# Patient Record
Sex: Female | Born: 1965 | Race: Black or African American | Hispanic: No | Smoking: Never smoker
Health system: Southern US, Community
[De-identification: ages and names within clinical notes are randomized; demographics above are authoritative.]

## PROBLEM LIST (undated history)

## (undated) DIAGNOSIS — M8580 Other specified disorders of bone density and structure, unspecified site: Secondary | ICD-10-CM

## (undated) DIAGNOSIS — R55 Syncope and collapse: Secondary | ICD-10-CM

## (undated) DIAGNOSIS — E559 Vitamin D deficiency, unspecified: Secondary | ICD-10-CM

## (undated) DIAGNOSIS — I2510 Atherosclerotic heart disease of native coronary artery without angina pectoris: Secondary | ICD-10-CM

## (undated) DIAGNOSIS — R569 Unspecified convulsions: Secondary | ICD-10-CM

## (undated) DIAGNOSIS — E7800 Pure hypercholesterolemia, unspecified: Secondary | ICD-10-CM

## (undated) DIAGNOSIS — M519 Unspecified thoracic, thoracolumbar and lumbosacral intervertebral disc disorder: Secondary | ICD-10-CM

## (undated) HISTORY — DX: Atherosclerotic heart disease of native coronary artery without angina pectoris: I25.10

## (undated) HISTORY — DX: Unspecified convulsions: R56.9

## (undated) HISTORY — DX: Vitamin D deficiency, unspecified: E55.9

## (undated) HISTORY — DX: Other specified disorders of bone density and structure, unspecified site: M85.80

## (undated) HISTORY — DX: Unspecified thoracic, thoracolumbar and lumbosacral intervertebral disc disorder: M51.9

## (undated) HISTORY — DX: Syncope and collapse: R55

## (undated) HISTORY — DX: Pure hypercholesterolemia, unspecified: E78.00

---

## 1998-09-09 ENCOUNTER — Other Ambulatory Visit: Admission: RE | Admit: 1998-09-09 | Discharge: 1998-09-09 | Payer: Self-pay | Admitting: Obstetrics and Gynecology

## 1999-10-19 ENCOUNTER — Other Ambulatory Visit: Admission: RE | Admit: 1999-10-19 | Discharge: 1999-10-19 | Payer: Self-pay | Admitting: *Deleted

## 1999-11-26 ENCOUNTER — Encounter: Payer: Self-pay | Admitting: Emergency Medicine

## 1999-11-26 ENCOUNTER — Emergency Department (HOSPITAL_COMMUNITY): Admission: EM | Admit: 1999-11-26 | Discharge: 1999-11-26 | Payer: Self-pay | Admitting: Emergency Medicine

## 2000-10-23 ENCOUNTER — Other Ambulatory Visit: Admission: RE | Admit: 2000-10-23 | Discharge: 2000-10-23 | Payer: Self-pay | Admitting: *Deleted

## 2001-05-08 DIAGNOSIS — R55 Syncope and collapse: Secondary | ICD-10-CM

## 2001-05-08 HISTORY — DX: Syncope and collapse: R55

## 2001-07-19 ENCOUNTER — Encounter: Payer: Self-pay | Admitting: Emergency Medicine

## 2001-07-19 ENCOUNTER — Emergency Department (HOSPITAL_COMMUNITY): Admission: EM | Admit: 2001-07-19 | Discharge: 2001-07-19 | Payer: Self-pay | Admitting: Emergency Medicine

## 2001-08-22 ENCOUNTER — Ambulatory Visit (HOSPITAL_COMMUNITY): Admission: RE | Admit: 2001-08-22 | Discharge: 2001-08-22 | Payer: Self-pay | Admitting: Cardiology

## 2001-10-23 ENCOUNTER — Ambulatory Visit (HOSPITAL_BASED_OUTPATIENT_CLINIC_OR_DEPARTMENT_OTHER): Admission: RE | Admit: 2001-10-23 | Discharge: 2001-10-23 | Payer: Self-pay | Admitting: *Deleted

## 2001-10-30 ENCOUNTER — Other Ambulatory Visit: Admission: RE | Admit: 2001-10-30 | Discharge: 2001-10-30 | Payer: Self-pay | Admitting: Obstetrics and Gynecology

## 2002-03-03 ENCOUNTER — Emergency Department (HOSPITAL_COMMUNITY): Admission: EM | Admit: 2002-03-03 | Discharge: 2002-03-03 | Payer: Self-pay | Admitting: *Deleted

## 2002-03-30 ENCOUNTER — Emergency Department (HOSPITAL_COMMUNITY): Admission: EM | Admit: 2002-03-30 | Discharge: 2002-03-30 | Payer: Self-pay | Admitting: Emergency Medicine

## 2002-03-30 ENCOUNTER — Encounter: Payer: Self-pay | Admitting: Emergency Medicine

## 2002-03-30 ENCOUNTER — Inpatient Hospital Stay (HOSPITAL_COMMUNITY): Admission: EM | Admit: 2002-03-30 | Discharge: 2002-04-01 | Payer: Self-pay | Admitting: Emergency Medicine

## 2002-03-30 ENCOUNTER — Encounter: Payer: Self-pay | Admitting: Neurology

## 2002-12-01 ENCOUNTER — Other Ambulatory Visit: Admission: RE | Admit: 2002-12-01 | Discharge: 2002-12-01 | Payer: Self-pay | Admitting: Obstetrics and Gynecology

## 2004-06-23 ENCOUNTER — Encounter: Admission: RE | Admit: 2004-06-23 | Discharge: 2004-06-23 | Payer: Self-pay | Admitting: Internal Medicine

## 2005-07-11 ENCOUNTER — Encounter: Admission: RE | Admit: 2005-07-11 | Discharge: 2005-07-11 | Payer: Self-pay | Admitting: Internal Medicine

## 2006-08-13 ENCOUNTER — Encounter: Admission: RE | Admit: 2006-08-13 | Discharge: 2006-08-13 | Payer: Self-pay | Admitting: Internal Medicine

## 2006-08-22 ENCOUNTER — Encounter: Admission: RE | Admit: 2006-08-22 | Discharge: 2006-08-22 | Payer: Self-pay | Admitting: Internal Medicine

## 2007-10-17 ENCOUNTER — Encounter: Admission: RE | Admit: 2007-10-17 | Discharge: 2007-10-17 | Payer: Self-pay | Admitting: Internal Medicine

## 2008-08-20 ENCOUNTER — Encounter: Admission: RE | Admit: 2008-08-20 | Discharge: 2008-08-20 | Payer: Self-pay | Admitting: Internal Medicine

## 2008-11-24 ENCOUNTER — Encounter: Admission: RE | Admit: 2008-11-24 | Discharge: 2008-11-24 | Payer: Self-pay | Admitting: Obstetrics and Gynecology

## 2009-12-01 ENCOUNTER — Encounter: Admission: RE | Admit: 2009-12-01 | Discharge: 2009-12-01 | Payer: Self-pay | Admitting: Internal Medicine

## 2010-09-23 NOTE — Procedures (Signed)
[facility]  Patient:    BRIAR, SWORD Visit Number: [id number] MRN: [id number]          Service Type: CAT Location: [facility] 2852 01 Attending Physician:  Armanda Magic Dictated by:   Armanda Magic, M.D. Proc. Date: 08/22/01 Admit Date:  08/22/2001 Discharge Date: 08/22/2001                             Procedure Report  DATE OF BIRTH:  Oct 03, 1965  PROCEDURE:  Tilt table testing.  SURGEON:  Armanda Magic, M.D.  INDICATIONS:  This is a 45 year old white female with a history of dizzy spells and presyncope.  She presents for tilt table testing.  DESCRIPTION OF PROCEDURE:  The patient was brought to the cardiac catheterization laboratory in a fasting, nonsedated state.  Informed consent was obtained.  The patient was connected to continuous heart rate and pulse oximetry monitoring and intermittent blood pressure monitoring.  The blood pressure was monitored for a total of nine minutes in the supine position. Blood pressure ranged from 131/82 to 143/89 mmHg.  Heart rate in the 60s.  The patient was then tilted upright to 70 degrees for a total of 30 minutes.  The lowest blood pressure achieved with upright tilt was 117/89 with heart rates in the 70s.  The patient was then placed supine and Accupril at 1 mcg per minute was started and then increased to 1.5 mcg per minute to achieve an increase in heart rate of 20%.  Made one blood pressure on Accupril 1.5 mcg per minute was 127/58 mmHg and heart rate 81.  The patient was then tilted upright 7 degrees for 15 minutes.  Lowest blood pressure achieved on this upright tilt was 117/66 with a heart rate in the 90s to low 100s.  The patient was then tilted back supine and  later discharged to home.  IMPRESSION: 1. Dizziness and presyncope. 2. Negative tilt table test. 3. Followup with Dr. Mayford Knife in two weeks. Dictated by:   Armanda Magic, M.D. Attending Physician:  Armanda Magic DD:   09/02/01 TD:  09/03/01 Job: [id number]
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
## 2010-12-06 ENCOUNTER — Other Ambulatory Visit: Payer: Self-pay | Admitting: Internal Medicine

## 2010-12-06 DIAGNOSIS — Z1231 Encounter for screening mammogram for malignant neoplasm of breast: Secondary | ICD-10-CM

## 2010-12-13 ENCOUNTER — Ambulatory Visit
Admission: RE | Admit: 2010-12-13 | Discharge: 2010-12-13 | Disposition: A | Discharge status: Home / Self Care | Payer: 59 | Source: Ambulatory Visit | Attending: Internal Medicine | Admitting: Internal Medicine

## 2010-12-13 DIAGNOSIS — Z1231 Encounter for screening mammogram for malignant neoplasm of breast: Secondary | ICD-10-CM

## 2011-12-15 ENCOUNTER — Other Ambulatory Visit: Payer: Self-pay | Admitting: Obstetrics and Gynecology

## 2011-12-15 DIAGNOSIS — Z1231 Encounter for screening mammogram for malignant neoplasm of breast: Secondary | ICD-10-CM

## 2011-12-18 ENCOUNTER — Other Ambulatory Visit: Payer: Self-pay | Admitting: Internal Medicine

## 2011-12-18 DIAGNOSIS — K439 Ventral hernia without obstruction or gangrene: Secondary | ICD-10-CM

## 2011-12-19 ENCOUNTER — Ambulatory Visit
Admission: RE | Admit: 2011-12-19 | Discharge: 2011-12-19 | Disposition: A | Discharge status: Home / Self Care | Payer: 59 | Source: Ambulatory Visit | Attending: Internal Medicine | Admitting: Internal Medicine

## 2011-12-19 DIAGNOSIS — K439 Ventral hernia without obstruction or gangrene: Secondary | ICD-10-CM

## 2012-01-02 ENCOUNTER — Ambulatory Visit
Admission: RE | Admit: 2012-01-02 | Discharge: 2012-01-02 | Disposition: A | Discharge status: Home / Self Care | Payer: 59 | Source: Ambulatory Visit | Attending: Obstetrics and Gynecology | Admitting: Obstetrics and Gynecology

## 2012-01-02 DIAGNOSIS — Z1231 Encounter for screening mammogram for malignant neoplasm of breast: Secondary | ICD-10-CM

## 2012-12-26 ENCOUNTER — Telehealth: Payer: Self-pay | Admitting: Neurology

## 2012-12-26 NOTE — Telephone Encounter (Signed)
Patient needs to be assigned to a new provider and also needs an appt.  Last OV was 07/2011.  Message was sent to Triage for assignment.

## 2012-12-30 ENCOUNTER — Telehealth: Payer: Self-pay | Admitting: Diagnostic Neuroimaging

## 2012-12-30 ENCOUNTER — Other Ambulatory Visit: Payer: Self-pay

## 2012-12-30 MED ORDER — ZONISAMIDE 100 MG PO CAPS: 300.0000 mg | ORAL_CAPSULE | Freq: Every day | ORAL | Status: DC

## 2012-12-30 NOTE — Telephone Encounter (Signed)
Reassigned to Dr. Marjory Lies.  Sent to scheduler.

## 2012-12-30 NOTE — Telephone Encounter (Signed)
Former Love patient assigned to Dr Penumalli. 

## 2013-01-16 ENCOUNTER — Other Ambulatory Visit: Payer: Self-pay

## 2013-01-16 DIAGNOSIS — Z1231 Encounter for screening mammogram for malignant neoplasm of breast: Secondary | ICD-10-CM

## 2013-02-06 ENCOUNTER — Ambulatory Visit
Admission: RE | Admit: 2013-02-06 | Discharge: 2013-02-06 | Disposition: A | Discharge status: Home / Self Care | Payer: 59 | Source: Ambulatory Visit

## 2013-02-06 DIAGNOSIS — Z1231 Encounter for screening mammogram for malignant neoplasm of breast: Secondary | ICD-10-CM

## 2013-04-16 ENCOUNTER — Encounter (INDEPENDENT_AMBULATORY_CARE_PROVIDER_SITE_OTHER): Payer: Self-pay

## 2013-04-16 ENCOUNTER — Encounter: Payer: Self-pay | Admitting: Diagnostic Neuroimaging

## 2013-04-16 ENCOUNTER — Ambulatory Visit (INDEPENDENT_AMBULATORY_CARE_PROVIDER_SITE_OTHER): Payer: 59 | Admitting: Diagnostic Neuroimaging

## 2013-04-16 VITALS — BP 143/91 | HR 64 | Temp 98.1°F | Ht 64.0 in | Wt 181.5 lb

## 2013-04-16 DIAGNOSIS — G40309 Generalized idiopathic epilepsy and epileptic syndromes, not intractable, without status epilepticus: Secondary | ICD-10-CM | POA: Insufficient documentation

## 2013-04-16 DIAGNOSIS — G40909 Epilepsy, unspecified, not intractable, without status epilepticus: Secondary | ICD-10-CM

## 2013-04-16 MED ORDER — ZONISAMIDE 100 MG PO CAPS: 300.0000 mg | ORAL_CAPSULE | Freq: Every day | ORAL | Status: DC

## 2013-04-16 NOTE — Progress Notes (Signed)
[facility]  PATIENT: Holly Colon DOB: [birth year]  REFERRING CLINICIAN:  HISTORY FROM: patient REASON FOR VISIT: follow up (Dr. Sandria Manly transfer)   HISTORICAL  CHIEF COMPLAINT:  Chief Complaint  Patient presents with  . Follow-up    Epilepsy    HISTORY OF PRESENT ILLNESS:   UPDATE 04/16/13: Patient is doing well. No further seizures. She's tolerating the medication, zonisamide 300 mg every morning without difficulty. Last seizure was 02/26/2011.  PRIOR HPI (07/12/11, CM): No seizure since 02/26/11. Pt had missed several doses of medication.  She says this happens periodically and she has seizures. She claims she saw Dr. Katrinka Blazing, cardiologist after last visit, had EKG which was normal and he suggested she get stress test at the age of 47. She denies missing further doses of medications. She denies macropsia, micropsia, strange odors or tastes.  PRIOR HPI (02/27/11, Dr. Sandria Manly): 47 year old left-handed single African American female with a history of generalized major motor seizures beginning in 2001. EEG showed bilateral synchronous spike and wave forms and MRIs of the brain 03/19/2002 and 08/31/06 and MRA 08/31/06 were normal. She had a skin reaction to Dilantin which had to be discontinued and her current medicines are Zonegran 300 mg once daily and for hyperlipidemia, she is on Lipitor 20 mg daily. She misses her medications and  when she does, she can have a seizure.This happened 04/25/2009. She averages about one seizure per year. The daughter reports that her eyes turned blank and she had a generalized major motor seizure. This is associated with urinary incontinence but not tongue biting. She is independent in activities of daily living. Last DEXA scan 09/20/09 with Z scores in the expected range. She had chest pain recently and underwent an EKG.The patient is independent in activities of daily living and her daughter is now 39.The patient was voting with her father  who has has had a stroke and had an episode of blanking  on the last name. Late in the day yesterday on Sunday she felt sluggish and lay down. Her daughter heard  a thump on the floor. She went upstairs and found the patient lying down.She had a abrasion to her scalp. She was slow in answering questions. She lost her hearing and bit her tongue. She is not to take her medicines correctly. She took them Friday and possibly Saturday. EMT was called. The patient refused to go to the hospital and called me today. She has not been abusing drugs or alcohol. Her last seizure prior to yesterday was 04/25/09  The patient has been driving her car which I warned her against today. She denies macropsia, micropsia, strange odors or tastes.   REVIEW OF SYSTEMS: Full 14 system review of systems performed and notable only for fatigue, chills, fatigue, easy bruising, shortness of breath.   ALLERGIES: No Known Allergies  HOME MEDICATIONS: Outpatient Prescriptions Prior to Visit  Medication Sig Dispense Refill  . zonisamide (ZONEGRAN) 100 MG capsule Take 3 capsules (300 mg total) by mouth at bedtime.  42 capsule  1   No facility-administered medications prior to visit.    PAST MEDICAL HISTORY: Past Medical History  Diagnosis Date  . High cholesterol   . Seizures     PAST SURGICAL HISTORY: History reviewed. No pertinent past surgical history.  FAMILY HISTORY: Family History  Problem Relation Age of Onset  . Heart attack Father   . Stroke Father     SOCIAL HISTORY:  History   Social History  . Marital  Status: Divorced    Spouse Name: N/A    Number of Children: 1  . Years of Education: 4   Occupational History  .  [employer]   Social History Main Topics  . Smoking status: Never Smoker   . Smokeless tobacco: Never Used  . Alcohol Use: No  . Drug Use: No  . Sexual Activity: Not on file   Other Topics Concern  . Not on file   Social History Narrative   Caffeine use: very little      PHYSICAL EXAM  Filed Vitals:   04/16/13 0846  BP: 143/91  Pulse: 64  Temp: 98.1 F (36.7 C)  TempSrc: Oral  Height: 5\' 4"  (1.626 m)  Weight: 181 lb 8 oz (82.328 kg)    Not recorded    Body mass index is 31.14 kg/(m^2).  GENERAL EXAM: Patient is in no distress; well developed, nourished and groomed; neck is supple  CARDIOVASCULAR: Regular rate and rhythm, no murmurs, no carotid bruits  NEUROLOGIC: MENTAL STATUS: awake, alert, oriented to person, place and time, recent and remote memory intact, normal attention and concentration, language fluent, comprehension intact, naming intact, fund of knowledge appropriate CRANIAL NERVE: no papilledema on fundoscopic exam, pupils equal and reactive to light, visual fields full to confrontation, extraocular muscles intact, no nystagmus, facial sensation and strength symmetric, hearing intact, palate elevates symmetrically, uvula midline, shoulder shrug symmetric, tongue midline. MOTOR: normal bulk and tone, full strength in the BUE, BLE SENSORY: normal and symmetric to light touch, temperature, vibration COORDINATION: finger-nose-finger, fine finger movements normal REFLEXES: deep tendon reflexes present and symmetric GAIT/STATION: narrow based gait; able to walk on toes, heels and tandem; romberg is negative   DIAGNOSTIC DATA (LABS, IMAGING, TESTING)  No results found for this basename: WBC, HGB, HCT, MCV, PLT   No results found for this basename: na, k, cl, co2, glucose, bun, creatinine, calcium, prot, albumin, ast, alt, alkphos, bilitot, gfrnonaa, gfraa   No results found for this basename: CHOL, HDL, LDLCALC, LDLDIRECT, TRIG, CHOLHDL   No results found for this basename: HGBA1C   No results found for this basename: VITAMINB12   No results found for this basename: TSH   - Records from [facility] EMR reviewed (old notes from Dr. Sandria Manly and Dr. Thad Ranger and MRI / EEG reports,)  03/24/02 MRI brain (with and without) -  normal  03/31/02 EEG - generalized, central dominant slow waves and spike discharges; supportive of generalized epilepsy  04/01/02 EEG - generalized epileptiform (sharps waves, spike and wave discharges) activity (3 hz); consistent with primary generalized epilepsy   ASSESSMENT AND PLAN  47 y.o. year old female here with primary generalized epilepsy, doing well. No seizures since 03/29/2011. No side effects of medication.  Dx: Primary generalized epilepsy  PLAN: - Continue zonisamide 300 mg every morning  - Take daily multivitamin and calcium / vitamin D supplement  Return in about 1 year (around 04/16/2014) for with Heide Guile or Shereka Lafortune.   Suanne Marker, MD 04/16/2013, 9:00 AM Certified in Neurology, Neurophysiology and Neuroimaging  [facility] [address] [place], [place] [address] [phone]

## 2013-05-08 DIAGNOSIS — M8580 Other specified disorders of bone density and structure, unspecified site: Secondary | ICD-10-CM

## 2013-05-08 HISTORY — DX: Other specified disorders of bone density and structure, unspecified site: M85.80

## 2013-10-06 ENCOUNTER — Telehealth: Payer: Self-pay | Admitting: *Deleted

## 2013-10-06 NOTE — Telephone Encounter (Signed)
I called and LMVM for pt at home # re: order for bone density.  After consulting C. Daphine Deutscher, NP , this could go to pcp (Dr. Marjory Lies out of office and Eber Jones had seen in past (2013).   I relayed that checking her pcp, for order for continued bone density f/u.  Dr. Donette Larry [phone]

## 2014-02-20 ENCOUNTER — Other Ambulatory Visit: Payer: Self-pay

## 2014-02-20 DIAGNOSIS — Z1231 Encounter for screening mammogram for malignant neoplasm of breast: Secondary | ICD-10-CM

## 2014-03-06 ENCOUNTER — Ambulatory Visit: Payer: 59

## 2014-04-06 ENCOUNTER — Other Ambulatory Visit: Payer: Self-pay | Admitting: Internal Medicine

## 2014-04-06 DIAGNOSIS — R590 Localized enlarged lymph nodes: Secondary | ICD-10-CM

## 2014-04-09 ENCOUNTER — Inpatient Hospital Stay
Admission: RE | Admit: 2014-04-09 | Discharge: 2014-04-09 | Disposition: A | Discharge status: Home / Self Care | Payer: 59 | Source: Ambulatory Visit | Attending: Internal Medicine | Admitting: Internal Medicine

## 2014-04-16 ENCOUNTER — Ambulatory Visit: Payer: 59 | Admitting: Diagnostic Neuroimaging

## 2014-05-14 ENCOUNTER — Other Ambulatory Visit: Payer: Self-pay | Admitting: Diagnostic Neuroimaging

## 2014-05-26 ENCOUNTER — Encounter: Payer: Self-pay | Admitting: Diagnostic Neuroimaging

## 2014-05-26 ENCOUNTER — Ambulatory Visit (INDEPENDENT_AMBULATORY_CARE_PROVIDER_SITE_OTHER): Payer: 59 | Admitting: Diagnostic Neuroimaging

## 2014-05-26 VITALS — BP 141/85 | HR 63 | Temp 98.5°F | Ht 64.0 in | Wt 191.8 lb

## 2014-05-26 DIAGNOSIS — G40309 Generalized idiopathic epilepsy and epileptic syndromes, not intractable, without status epilepticus: Secondary | ICD-10-CM

## 2014-05-26 MED ORDER — ZONISAMIDE 100 MG PO CAPS: 300.0000 mg | ORAL_CAPSULE | Freq: Every day | ORAL | Status: AC

## 2014-05-26 NOTE — Patient Instructions (Signed)
Continue zonisamide.

## 2014-05-26 NOTE — Progress Notes (Signed)
[facility]  PATIENT: Holly Colon DOB: 12-08-1965  REFERRING CLINICIAN:  HISTORY FROM: patient REASON FOR VISIT: follow up (Dr. Sandria Manly transfer)   HISTORICAL  CHIEF COMPLAINT:  No chief complaint on file.   HISTORY OF PRESENT ILLNESS:   UPDATE 05/26/14: Since last visit, doing well. No further events. Tolerating ZNG  qAM.   UPDATE 04/16/13: Patient is doing well. No further seizures. She's tolerating the medication, zonisamide 300 mg every morning without difficulty. Last seizure was 02/26/2011.  PRIOR HPI (07/12/11, CM): No seizure since 02/26/11. Pt had missed several doses of medication.  She says this happens periodically and she has seizures. She claims she saw Dr. Katrinka Blazing, cardiologist after last visit, had EKG which was normal and he suggested she get stress test at the age of 71. She denies missing further doses of medications. She denies macropsia, micropsia, strange odors or tastes.  PRIOR HPI (02/27/11, Dr. Sandria Manly): 49 year old left-handed single African American female with a history of generalized major motor seizures beginning in 2001. EEG showed bilateral synchronous spike and wave forms and MRIs of the brain 03/19/2002 and 08/31/06 and MRA 08/31/06 were normal. She had a skin reaction to Dilantin which had to be discontinued and her current medicines are Zonegran 300 mg once daily and for hyperlipidemia, she is on Lipitor 20 mg daily. She misses her medications and  when she does, she can have a seizure.This happened 04/25/2009. She averages about one seizure per year. The daughter reports that her eyes turned blank and she had a generalized major motor seizure. This is associated with urinary incontinence but not tongue biting. She is independent in activities of daily living. Last DEXA scan 09/20/09 with Z scores in the expected range. She had chest pain recently and underwent an EKG.The patient is independent in activities of daily living and her  daughter is now 28.The patient was voting with her father who has has had a stroke and had an episode of blanking  on the last name. Late in the day yesterday on Sunday she felt sluggish and lay down. Her daughter heard  a thump on the floor. She went upstairs and found the patient lying down.She had a abrasion to her scalp. She was slow in answering questions. She lost her hearing and bit her tongue. She is not to take her medicines correctly. She took them Friday and possibly Saturday. EMT was called. The patient refused to go to the hospital and called me today. She has not been abusing drugs or alcohol. Her last seizure prior to yesterday was 04/25/09  The patient has been driving her car which I warned her against today. She denies macropsia, micropsia, strange odors or tastes.   REVIEW OF SYSTEMS: Full 14 system review of systems performed and notable only for fatigue eye discharge cold intolerance aching muscles.    ALLERGIES: No Known Allergies  HOME MEDICATIONS: Outpatient Prescriptions Prior to Visit  Medication Sig Dispense Refill  . atorvastatin (LIPITOR) 20 MG tablet Take 20 mg by mouth daily.    Marland Kitchen zonisamide (ZONEGRAN) 100 MG capsule TAKE 3 CAPSULES (300 MG TOTAL) DAILY 270 capsule 0   No facility-administered medications prior to visit.    PAST MEDICAL HISTORY: Past Medical History  Diagnosis Date  . High cholesterol   . Seizures     PAST SURGICAL HISTORY: History reviewed. No pertinent past surgical history.  FAMILY HISTORY: Family History  Problem Relation Age of Onset  . Heart attack Father   .  Stroke Father     SOCIAL HISTORY:  History   Social History  . Marital Status: Divorced    Spouse Name: N/A    Number of Children: 1  . Years of Education: 4   Occupational History  .  [employer]   Social History Main Topics  . Smoking status: Never Smoker   . Smokeless tobacco: Never Used  . Alcohol Use: No  . Drug Use: No  . Sexual Activity: Not on  file   Other Topics Concern  . Not on file   Social History Narrative   Caffeine use: very little     PHYSICAL EXAM  Filed Vitals:   05/26/14 1507  BP: 141/85  Pulse: 63  Temp: 98.5 F (36.9 C)  TempSrc: Oral  Height: 5\' 4"  (1.626 m)  Weight: 191 lb 12.8 oz (87 kg)    Not recorded      Body mass index is 32.91 kg/(m^2).  GENERAL EXAM: Patient is in no distress; well developed, nourished and groomed; neck is supple  CARDIOVASCULAR: Regular rate and rhythm, no murmurs, no carotid bruits  NEUROLOGIC: MENTAL STATUS: awake, alert, language fluent, comprehension intact, naming intact, fund of knowledge appropriate CRANIAL NERVE: no papilledema on fundoscopic exam, pupils equal and reactive to light, visual fields full to confrontation, extraocular muscles intact, no nystagmus, facial sensation and strength symmetric, hearing intact, palate elevates symmetrically, uvula midline, shoulder shrug symmetric, tongue midline. MOTOR: normal bulk and tone, full strength in the BUE, BLE SENSORY: normal and symmetric to light touch, temperature, vibration COORDINATION: finger-nose-finger, fine finger movements normal REFLEXES: deep tendon reflexes present and symmetric GAIT/STATION: narrow based gait; able to walk tandem; romberg is negative   DIAGNOSTIC DATA (LABS, IMAGING, TESTING)  No results found for: WBC No results found for: NA No results found for: CHOL No results found for: HGBA1C No results found for: VITAMINB12 No results found for: TSH   - Records from [employer] EMR reviewed (old notes from Dr. Sandria ManlyLove and Dr. Charleston RopesM. Reynolds and MRI / EEG reports,)  03/24/02 MRI brain (with and without) - normal  03/31/02 EEG - generalized, central dominant slow waves and spike discharges; supportive of generalized epilepsy  04/01/02 EEG - generalized epileptiform (sharps waves, spike and wave discharges) activity (3 hz); consistent with primary generalized epilepsy   ASSESSMENT  AND PLAN  49 y.o. year old female here with primary generalized epilepsy, doing well. No seizures since 03/29/2011. No side effects of medication.  Dx: Primary generalized epilepsy (stable)  PLAN: - Continue zonisamide 300 mg every morning  - Take daily multivitamin and calcium / vitamin D supplement - ask PCP if he would be willing to continue refills of zonegran, and then patient could follow up with us as needed  Return in about 1 year (around 05/27/2015).   Suanne MarkerVIKRAM R. Daphnee Preiss, MD 05/26/2014, 3:43 PM Certified in Neurology, Neurophysiology and Neuroimaging  [facility] [address], [address] [place], [place] [id number] [phone]

## 2014-06-02 ENCOUNTER — Other Ambulatory Visit: Payer: Self-pay | Admitting: Otolaryngology

## 2014-06-02 DIAGNOSIS — R221 Localized swelling, mass and lump, neck: Secondary | ICD-10-CM

## 2014-06-05 ENCOUNTER — Ambulatory Visit
Admission: RE | Admit: 2014-06-05 | Discharge: 2014-06-05 | Disposition: A | Discharge status: Home / Self Care | Payer: 59 | Source: Ambulatory Visit | Attending: Otolaryngology | Admitting: Otolaryngology

## 2014-06-05 DIAGNOSIS — R221 Localized swelling, mass and lump, neck: Secondary | ICD-10-CM

## 2014-06-05 MED ORDER — IOHEXOL 300 MG/ML  SOLN
75.0000 mL | Freq: Once | INTRAMUSCULAR | Status: AC | PRN
  Administered 2014-06-05: 75 mL via INTRAVENOUS

## 2015-02-23 ENCOUNTER — Other Ambulatory Visit: Payer: Self-pay

## 2015-02-23 DIAGNOSIS — Z1231 Encounter for screening mammogram for malignant neoplasm of breast: Secondary | ICD-10-CM

## 2015-03-05 ENCOUNTER — Ambulatory Visit
Admission: RE | Admit: 2015-03-05 | Discharge: 2015-03-05 | Disposition: A | Discharge status: Home / Self Care | Payer: 59 | Source: Ambulatory Visit

## 2015-03-05 DIAGNOSIS — Z1231 Encounter for screening mammogram for malignant neoplasm of breast: Secondary | ICD-10-CM

## 2015-03-10 ENCOUNTER — Other Ambulatory Visit: Payer: Self-pay | Admitting: Obstetrics and Gynecology

## 2015-03-10 DIAGNOSIS — R928 Other abnormal and inconclusive findings on diagnostic imaging of breast: Secondary | ICD-10-CM

## 2015-03-16 ENCOUNTER — Ambulatory Visit
Admission: RE | Admit: 2015-03-16 | Discharge: 2015-03-16 | Disposition: A | Discharge status: Home / Self Care | Payer: 59 | Source: Ambulatory Visit | Attending: Obstetrics and Gynecology | Admitting: Obstetrics and Gynecology

## 2015-03-16 DIAGNOSIS — R928 Other abnormal and inconclusive findings on diagnostic imaging of breast: Secondary | ICD-10-CM

## 2015-05-25 ENCOUNTER — Telehealth: Payer: Self-pay | Admitting: Diagnostic Neuroimaging

## 2015-05-25 NOTE — Telephone Encounter (Signed)
Spoke with patient who stated she has just seen her PCP, Dr Arther Dames. She is inquiring if he may follow her for seizures and manage her seizure medication. She stated she has had no seizure activity since last Jan FU in this office; last seizure Nov 2012.  Informed her that per Dr Richrd Humbles OV note from Jan 2016, he stated her PCP may manage her seizure medication. She stated her co pay for specialist is $180, and she would like to avoid that cost. For this reason she would like to cancel her 1 year FU in this office and return as needed.  Informed her would route her question to Dr Marjory Lies and call her back.

## 2015-05-25 NOTE — Telephone Encounter (Signed)
Yes agree with plan. -VRP 

## 2015-05-25 NOTE — Telephone Encounter (Signed)
Patient is calling and asking that she receive a call in regard to whether or not her general physician could prescribe her seizure medication without her having to come back to the neurologist.  Please call.

## 2015-05-25 NOTE — Telephone Encounter (Signed)
Per Dr Marjory Lies, spoke with patient and informed her that her PCP may continue to manage her seizure medication, and she may cancel her FU in this office. Advised she call this office for any needs. She requested the most recent OV note be faxed to Dr Arther Dames. Records faxed per pt's request, and FU appointment cancelled.

## 2015-05-27 ENCOUNTER — Ambulatory Visit: Payer: 59 | Admitting: Diagnostic Neuroimaging

## 2015-06-02 NOTE — Telephone Encounter (Signed)
Patient is calling back stating Dr. Eula Listen needs a note stating that it it is ok for Dr. Eula Listen to now prescribe the patient's seizure medicine.

## 2015-06-03 ENCOUNTER — Encounter: Payer: Self-pay | Admitting: *Deleted

## 2015-06-03 ENCOUNTER — Other Ambulatory Visit: Payer: Self-pay | Admitting: Diagnostic Neuroimaging

## 2015-06-03 NOTE — Telephone Encounter (Signed)
Letter as requested on Dr [employer] desk for review, signature.

## 2015-06-03 NOTE — Telephone Encounter (Signed)
Letter signed, faxed to Dr Burney Gauze.

## 2015-06-07 NOTE — Telephone Encounter (Signed)
Patient called to check status of letter to be sent to Dr. Donette Larry regarding prescribing her seizure medication and how to prescribe. Patient advised letter was faxed to Dr. Donette Larry on 06/03/15.

## 2015-07-15 ENCOUNTER — Other Ambulatory Visit: Payer: Self-pay | Admitting: Internal Medicine

## 2015-07-15 ENCOUNTER — Ambulatory Visit
Admission: RE | Admit: 2015-07-15 | Discharge: 2015-07-15 | Disposition: A | Discharge status: Home / Self Care | Payer: 59 | Source: Ambulatory Visit | Attending: Internal Medicine | Admitting: Internal Medicine

## 2015-07-15 DIAGNOSIS — R0602 Shortness of breath: Secondary | ICD-10-CM

## 2015-07-26 ENCOUNTER — Encounter: Payer: Self-pay | Admitting: *Deleted

## 2015-07-29 ENCOUNTER — Ambulatory Visit: Payer: 59 | Admitting: Cardiology

## 2016-02-28 ENCOUNTER — Other Ambulatory Visit: Payer: Self-pay | Admitting: Internal Medicine

## 2016-02-28 ENCOUNTER — Other Ambulatory Visit: Payer: Self-pay | Admitting: Obstetrics and Gynecology

## 2016-02-28 DIAGNOSIS — Z1231 Encounter for screening mammogram for malignant neoplasm of breast: Secondary | ICD-10-CM

## 2016-03-08 ENCOUNTER — Ambulatory Visit
Admission: RE | Admit: 2016-03-08 | Discharge: 2016-03-08 | Disposition: A | Discharge status: Home / Self Care | Payer: 59 | Source: Ambulatory Visit | Attending: Obstetrics and Gynecology | Admitting: Obstetrics and Gynecology

## 2016-03-08 DIAGNOSIS — Z1231 Encounter for screening mammogram for malignant neoplasm of breast: Secondary | ICD-10-CM

## 2016-03-15 ENCOUNTER — Ambulatory Visit
Admission: RE | Admit: 2016-03-15 | Discharge: 2016-03-15 | Disposition: A | Discharge status: Home / Self Care | Payer: 59 | Source: Ambulatory Visit | Attending: Nurse Practitioner | Admitting: Nurse Practitioner

## 2016-03-15 ENCOUNTER — Other Ambulatory Visit: Payer: Self-pay | Admitting: Nurse Practitioner

## 2016-03-15 DIAGNOSIS — K429 Umbilical hernia without obstruction or gangrene: Secondary | ICD-10-CM

## 2016-03-15 DIAGNOSIS — R109 Unspecified abdominal pain: Secondary | ICD-10-CM

## 2016-12-05 DIAGNOSIS — M5442 Lumbago with sciatica, left side: Secondary | ICD-10-CM | POA: Diagnosis not present

## 2016-12-05 DIAGNOSIS — M62838 Other muscle spasm: Secondary | ICD-10-CM | POA: Diagnosis not present

## 2016-12-25 ENCOUNTER — Other Ambulatory Visit: Payer: Self-pay | Admitting: Internal Medicine

## 2016-12-25 DIAGNOSIS — M5116 Intervertebral disc disorders with radiculopathy, lumbar region: Secondary | ICD-10-CM

## 2017-01-02 DIAGNOSIS — Z6821 Body mass index (BMI) 21.0-21.9, adult: Secondary | ICD-10-CM | POA: Diagnosis not present

## 2017-01-02 DIAGNOSIS — Z01419 Encounter for gynecological examination (general) (routine) without abnormal findings: Secondary | ICD-10-CM | POA: Diagnosis not present

## 2017-01-02 DIAGNOSIS — Z1159 Encounter for screening for other viral diseases: Secondary | ICD-10-CM | POA: Diagnosis not present

## 2017-01-04 ENCOUNTER — Ambulatory Visit
Admission: RE | Admit: 2017-01-04 | Discharge: 2017-01-04 | Disposition: A | Discharge status: Home / Self Care | Payer: 59 | Source: Ambulatory Visit | Attending: Internal Medicine | Admitting: Internal Medicine

## 2017-01-04 DIAGNOSIS — M48061 Spinal stenosis, lumbar region without neurogenic claudication: Secondary | ICD-10-CM | POA: Diagnosis not present

## 2017-01-04 DIAGNOSIS — M5116 Intervertebral disc disorders with radiculopathy, lumbar region: Secondary | ICD-10-CM

## 2017-01-05 ENCOUNTER — Other Ambulatory Visit: Payer: 59

## 2017-01-22 DIAGNOSIS — M545 Low back pain: Secondary | ICD-10-CM | POA: Diagnosis not present

## 2017-01-22 DIAGNOSIS — M25552 Pain in left hip: Secondary | ICD-10-CM | POA: Diagnosis not present

## 2017-01-24 DIAGNOSIS — M545 Low back pain: Secondary | ICD-10-CM | POA: Diagnosis not present

## 2017-01-24 DIAGNOSIS — M25552 Pain in left hip: Secondary | ICD-10-CM | POA: Diagnosis not present

## 2017-01-29 DIAGNOSIS — M545 Low back pain: Secondary | ICD-10-CM | POA: Diagnosis not present

## 2017-01-29 DIAGNOSIS — M79605 Pain in left leg: Secondary | ICD-10-CM | POA: Diagnosis not present

## 2017-02-02 DIAGNOSIS — M545 Low back pain: Secondary | ICD-10-CM | POA: Diagnosis not present

## 2017-02-02 DIAGNOSIS — M79605 Pain in left leg: Secondary | ICD-10-CM | POA: Diagnosis not present

## 2017-02-06 DIAGNOSIS — M79605 Pain in left leg: Secondary | ICD-10-CM | POA: Diagnosis not present

## 2017-02-06 DIAGNOSIS — M545 Low back pain: Secondary | ICD-10-CM | POA: Diagnosis not present

## 2017-02-14 DIAGNOSIS — M79605 Pain in left leg: Secondary | ICD-10-CM | POA: Diagnosis not present

## 2017-02-14 DIAGNOSIS — M545 Low back pain: Secondary | ICD-10-CM | POA: Diagnosis not present

## 2017-02-21 DIAGNOSIS — M545 Low back pain: Secondary | ICD-10-CM | POA: Diagnosis not present

## 2017-02-21 DIAGNOSIS — M79605 Pain in left leg: Secondary | ICD-10-CM | POA: Diagnosis not present

## 2017-02-26 DIAGNOSIS — M545 Low back pain: Secondary | ICD-10-CM | POA: Diagnosis not present

## 2017-02-26 DIAGNOSIS — M79605 Pain in left leg: Secondary | ICD-10-CM | POA: Diagnosis not present

## 2017-03-02 ENCOUNTER — Other Ambulatory Visit: Payer: Self-pay | Admitting: Obstetrics and Gynecology

## 2017-03-02 DIAGNOSIS — Z1231 Encounter for screening mammogram for malignant neoplasm of breast: Secondary | ICD-10-CM

## 2017-03-07 DIAGNOSIS — M79605 Pain in left leg: Secondary | ICD-10-CM | POA: Diagnosis not present

## 2017-03-07 DIAGNOSIS — M545 Low back pain: Secondary | ICD-10-CM | POA: Diagnosis not present

## 2017-03-14 DIAGNOSIS — M545 Low back pain: Secondary | ICD-10-CM | POA: Diagnosis not present

## 2017-03-14 DIAGNOSIS — M79605 Pain in left leg: Secondary | ICD-10-CM | POA: Diagnosis not present

## 2017-03-23 ENCOUNTER — Ambulatory Visit
Admission: RE | Admit: 2017-03-23 | Discharge: 2017-03-23 | Disposition: A | Discharge status: Home / Self Care | Payer: 59 | Source: Ambulatory Visit | Attending: Obstetrics and Gynecology | Admitting: Obstetrics and Gynecology

## 2017-03-23 DIAGNOSIS — Z1231 Encounter for screening mammogram for malignant neoplasm of breast: Secondary | ICD-10-CM | POA: Diagnosis not present

## 2017-04-03 DIAGNOSIS — M79605 Pain in left leg: Secondary | ICD-10-CM | POA: Diagnosis not present

## 2017-04-03 DIAGNOSIS — M545 Low back pain: Secondary | ICD-10-CM | POA: Diagnosis not present

## 2017-04-24 DIAGNOSIS — Z1389 Encounter for screening for other disorder: Secondary | ICD-10-CM | POA: Diagnosis not present

## 2017-04-24 DIAGNOSIS — E782 Mixed hyperlipidemia: Secondary | ICD-10-CM | POA: Diagnosis not present

## 2017-04-24 DIAGNOSIS — M8580 Other specified disorders of bone density and structure, unspecified site: Secondary | ICD-10-CM | POA: Diagnosis not present

## 2017-04-24 DIAGNOSIS — Z0000 Encounter for general adult medical examination without abnormal findings: Secondary | ICD-10-CM | POA: Diagnosis not present

## 2017-09-19 DIAGNOSIS — J029 Acute pharyngitis, unspecified: Secondary | ICD-10-CM | POA: Diagnosis not present

## 2017-09-19 DIAGNOSIS — J019 Acute sinusitis, unspecified: Secondary | ICD-10-CM | POA: Diagnosis not present

## 2017-11-07 ENCOUNTER — Other Ambulatory Visit: Payer: Self-pay | Admitting: Internal Medicine

## 2017-11-07 ENCOUNTER — Ambulatory Visit
Admission: RE | Admit: 2017-11-07 | Discharge: 2017-11-07 | Disposition: A | Discharge status: Home / Self Care | Payer: 59 | Source: Ambulatory Visit | Attending: Internal Medicine | Admitting: Internal Medicine

## 2017-11-07 DIAGNOSIS — M25572 Pain in left ankle and joints of left foot: Secondary | ICD-10-CM | POA: Diagnosis not present

## 2017-11-14 ENCOUNTER — Other Ambulatory Visit: Payer: Self-pay | Admitting: Internal Medicine

## 2017-11-14 DIAGNOSIS — S93492A Sprain of other ligament of left ankle, initial encounter: Secondary | ICD-10-CM | POA: Diagnosis not present

## 2017-11-14 DIAGNOSIS — M25572 Pain in left ankle and joints of left foot: Secondary | ICD-10-CM

## 2017-11-14 DIAGNOSIS — M545 Low back pain: Secondary | ICD-10-CM | POA: Diagnosis not present

## 2017-11-19 ENCOUNTER — Other Ambulatory Visit: Payer: Self-pay | Admitting: Internal Medicine

## 2017-11-19 DIAGNOSIS — M25572 Pain in left ankle and joints of left foot: Secondary | ICD-10-CM

## 2017-11-29 IMAGING — MG DIGITAL SCREENING BILATERAL MAMMOGRAM WITH CAD
4 series · 4 of 4 positions shown · non-contrast
Comparison: Previous exam(s).

CLINICAL DATA: Screening.

EXAM:
DIGITAL SCREENING BILATERAL MAMMOGRAM WITH CAD

[R MLO]
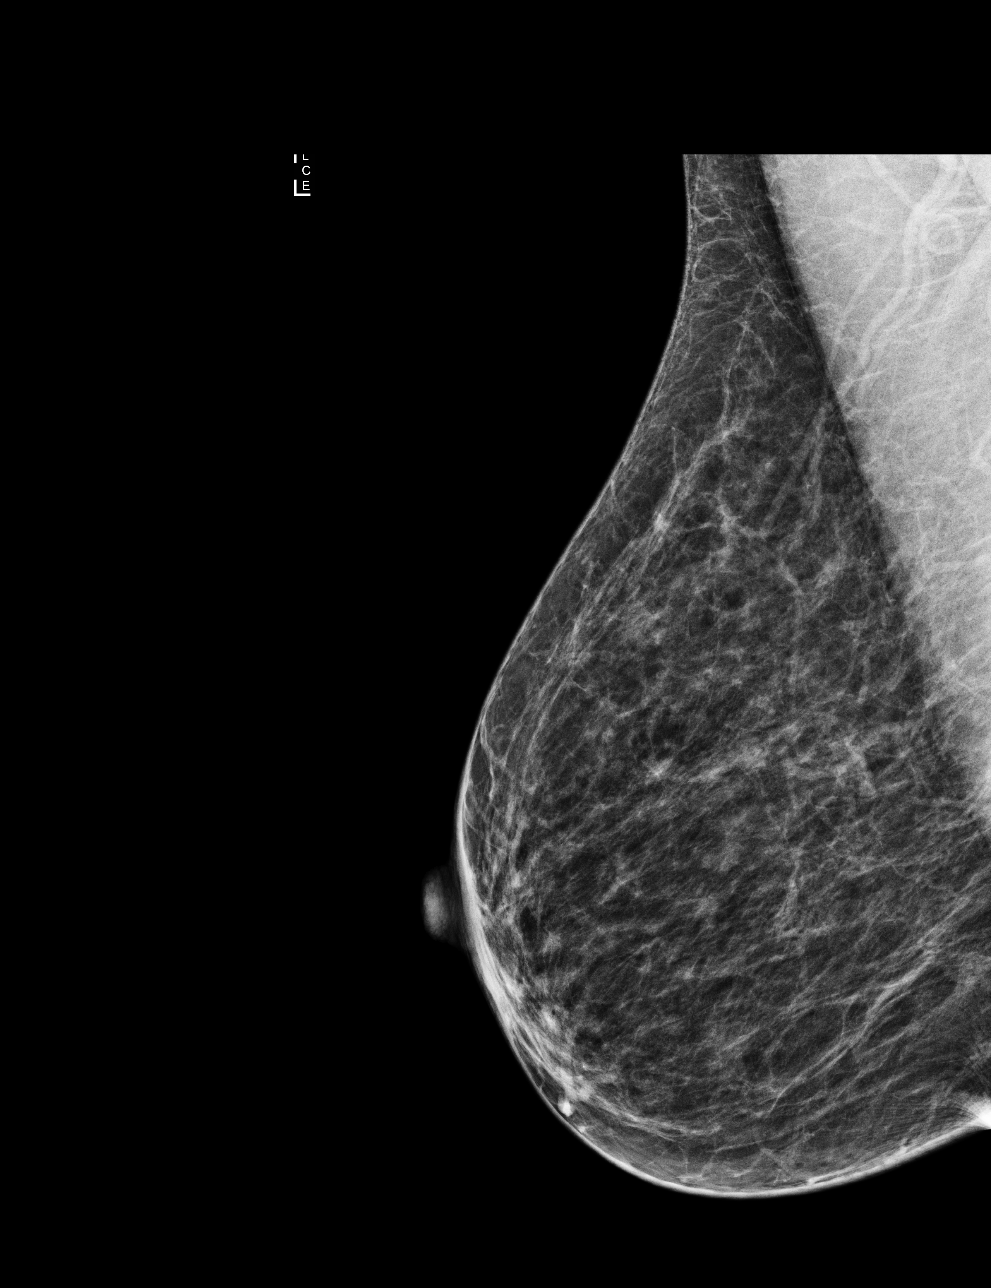

[L MLO]
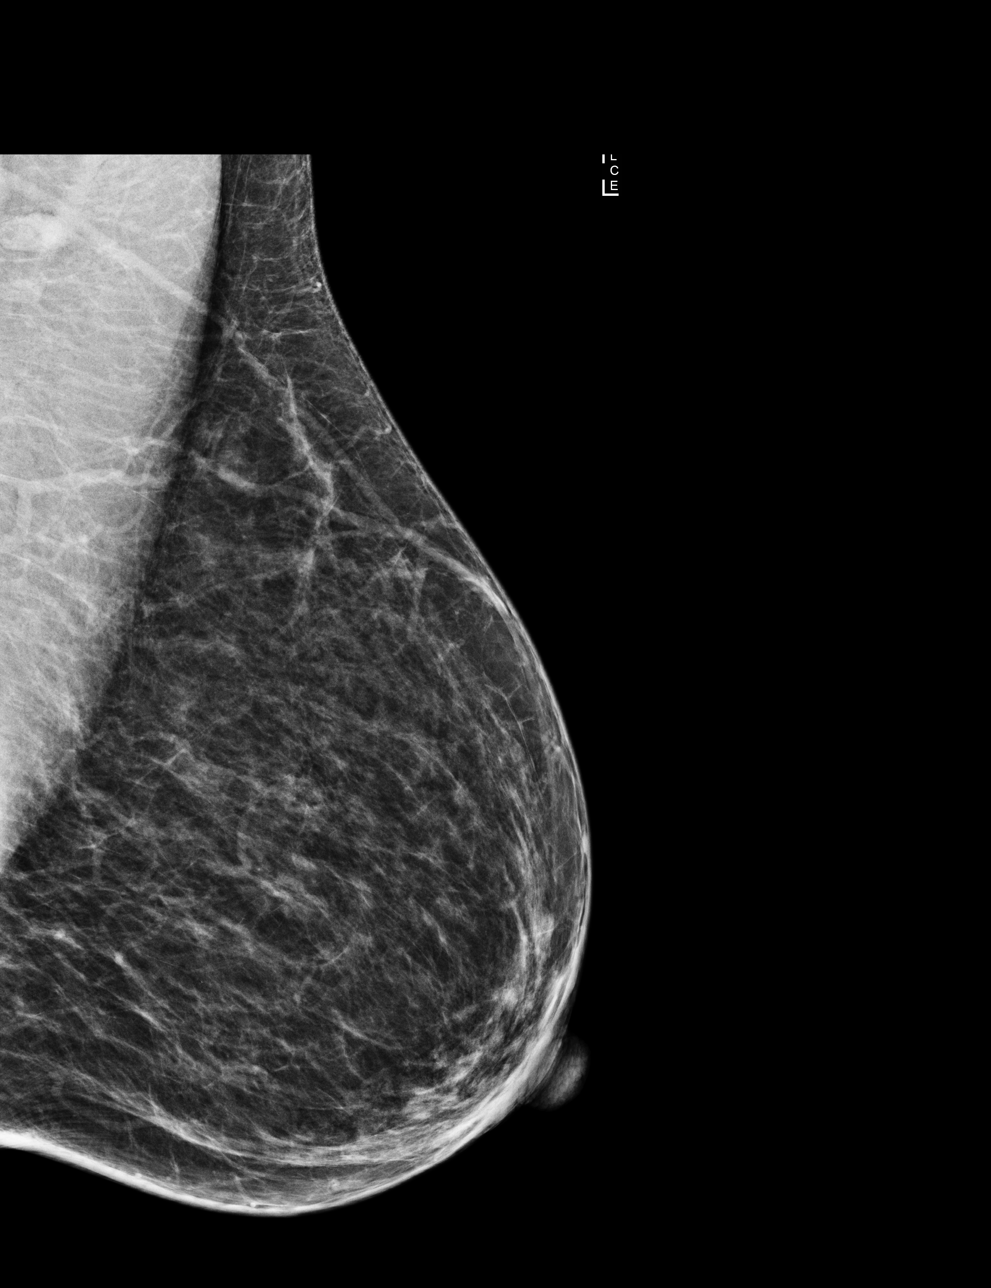

[L CC]
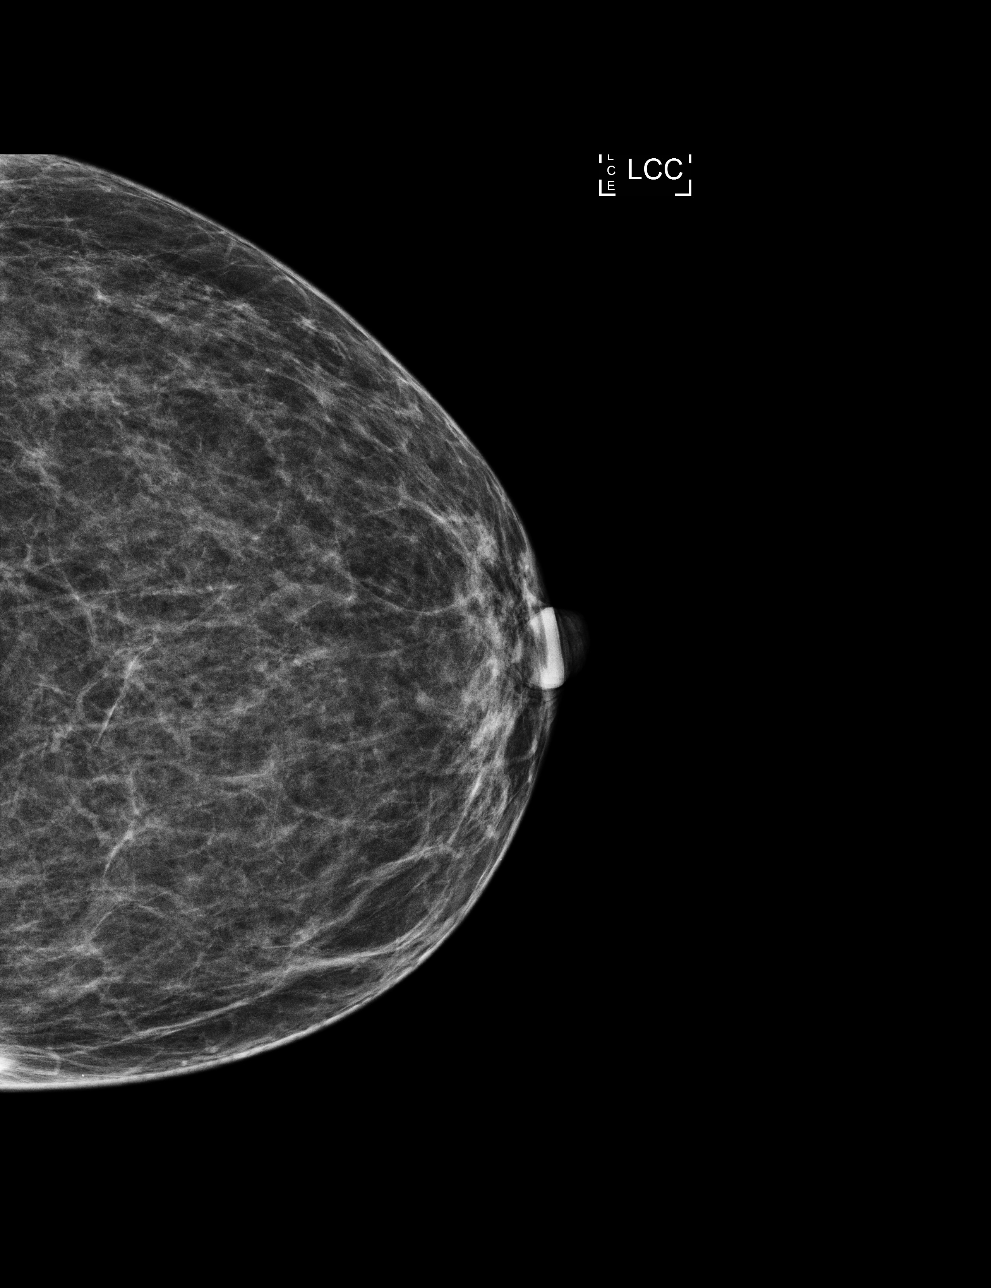

[R CC]
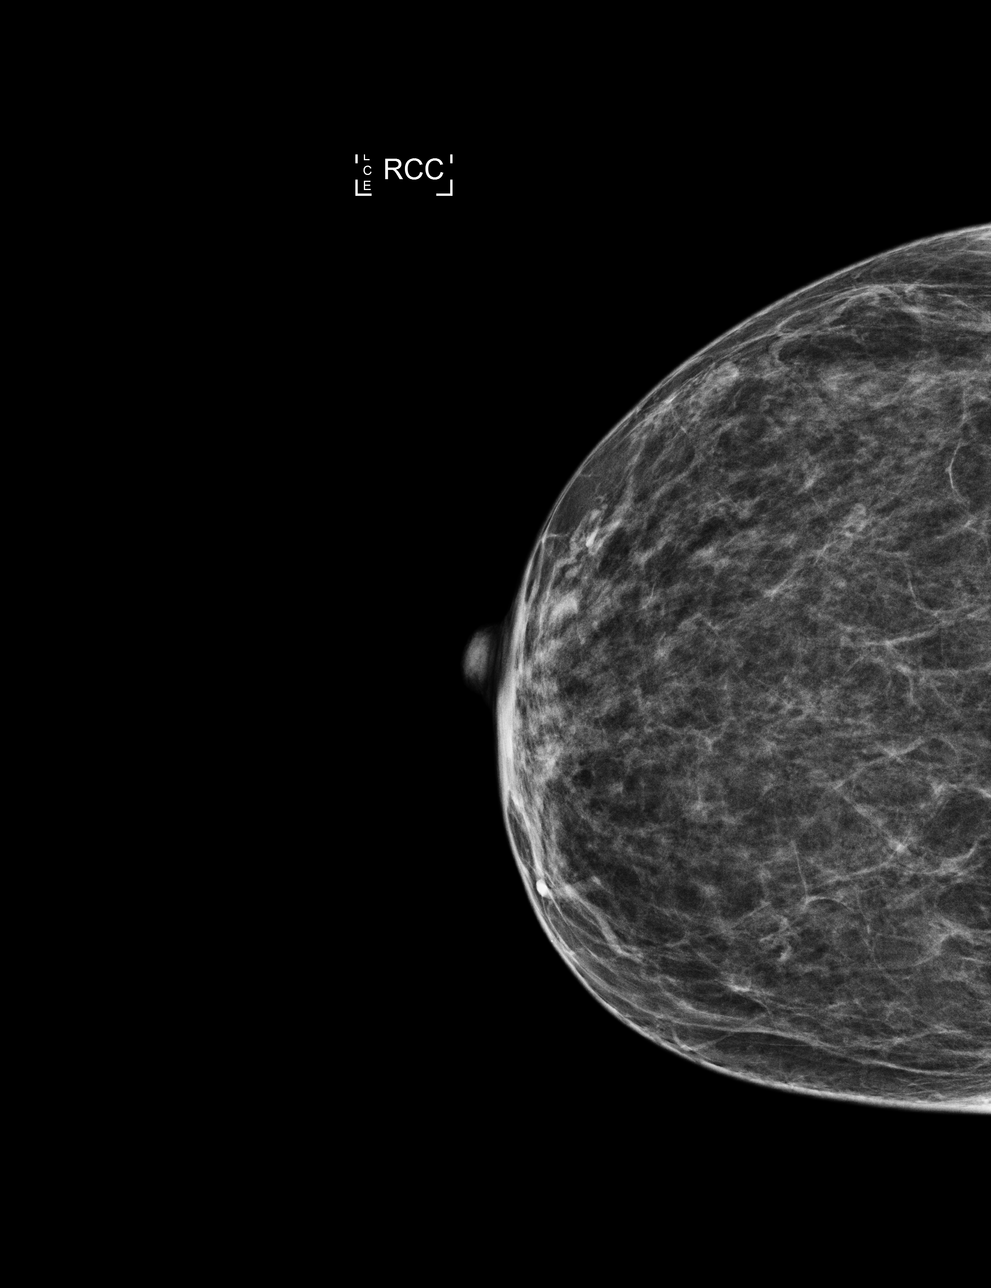

[4 of 4 positions shown; findings below may reference images not displayed]

ACR Breast Density Category b: There are scattered areas of
fibroglandular density.
FINDINGS: There are no findings suspicious for malignancy. Images were
processed with CAD.
IMPRESSION: No mammographic evidence of malignancy. A result letter of this
screening mammogram will be mailed directly to the patient.

RECOMMENDATION:
Screening mammogram in one year. (Code:AS-G-LCT)

BI-RADS CATEGORY  1: Negative.

## 2017-12-11 DIAGNOSIS — M25572 Pain in left ankle and joints of left foot: Secondary | ICD-10-CM | POA: Insufficient documentation

## 2017-12-11 DIAGNOSIS — M25552 Pain in left hip: Secondary | ICD-10-CM | POA: Diagnosis not present

## 2017-12-22 DIAGNOSIS — M48061 Spinal stenosis, lumbar region without neurogenic claudication: Secondary | ICD-10-CM | POA: Diagnosis not present

## 2017-12-31 DIAGNOSIS — M545 Low back pain: Secondary | ICD-10-CM | POA: Diagnosis not present

## 2018-01-08 DIAGNOSIS — M545 Low back pain: Secondary | ICD-10-CM | POA: Diagnosis not present

## 2018-01-17 DIAGNOSIS — M5416 Radiculopathy, lumbar region: Secondary | ICD-10-CM | POA: Diagnosis not present

## 2018-01-22 DIAGNOSIS — M5416 Radiculopathy, lumbar region: Secondary | ICD-10-CM | POA: Diagnosis not present

## 2018-01-24 DIAGNOSIS — M5416 Radiculopathy, lumbar region: Secondary | ICD-10-CM | POA: Diagnosis not present

## 2018-01-28 DIAGNOSIS — Z23 Encounter for immunization: Secondary | ICD-10-CM | POA: Diagnosis not present

## 2018-01-31 DIAGNOSIS — M5416 Radiculopathy, lumbar region: Secondary | ICD-10-CM | POA: Diagnosis not present

## 2018-02-07 DIAGNOSIS — M5416 Radiculopathy, lumbar region: Secondary | ICD-10-CM | POA: Diagnosis not present

## 2018-03-13 ENCOUNTER — Other Ambulatory Visit: Payer: Self-pay | Admitting: Internal Medicine

## 2018-03-13 ENCOUNTER — Other Ambulatory Visit: Payer: Self-pay | Admitting: Obstetrics and Gynecology

## 2018-03-13 DIAGNOSIS — Z1231 Encounter for screening mammogram for malignant neoplasm of breast: Secondary | ICD-10-CM

## 2018-03-22 DIAGNOSIS — Z6830 Body mass index (BMI) 30.0-30.9, adult: Secondary | ICD-10-CM | POA: Diagnosis not present

## 2018-03-22 DIAGNOSIS — R569 Unspecified convulsions: Secondary | ICD-10-CM | POA: Diagnosis not present

## 2018-03-22 DIAGNOSIS — M8589 Other specified disorders of bone density and structure, multiple sites: Secondary | ICD-10-CM | POA: Diagnosis not present

## 2018-03-22 DIAGNOSIS — Z01419 Encounter for gynecological examination (general) (routine) without abnormal findings: Secondary | ICD-10-CM | POA: Diagnosis not present

## 2018-03-23 DIAGNOSIS — M5136 Other intervertebral disc degeneration, lumbar region: Secondary | ICD-10-CM | POA: Diagnosis not present

## 2018-03-23 DIAGNOSIS — M5416 Radiculopathy, lumbar region: Secondary | ICD-10-CM | POA: Diagnosis not present

## 2018-03-25 ENCOUNTER — Ambulatory Visit
Admission: RE | Admit: 2018-03-25 | Discharge: 2018-03-25 | Disposition: A | Discharge status: Home / Self Care | Payer: 59 | Source: Ambulatory Visit | Attending: Obstetrics and Gynecology | Admitting: Obstetrics and Gynecology

## 2018-03-25 DIAGNOSIS — Z1231 Encounter for screening mammogram for malignant neoplasm of breast: Secondary | ICD-10-CM

## 2018-04-19 ENCOUNTER — Encounter: Payer: Self-pay | Admitting: Podiatry

## 2018-04-19 ENCOUNTER — Ambulatory Visit (INDEPENDENT_AMBULATORY_CARE_PROVIDER_SITE_OTHER): Payer: 59

## 2018-04-19 ENCOUNTER — Ambulatory Visit (INDEPENDENT_AMBULATORY_CARE_PROVIDER_SITE_OTHER): Payer: 59 | Admitting: Podiatry

## 2018-04-19 VITALS — BP 123/72 | HR 56 | Resp 16

## 2018-04-19 DIAGNOSIS — M2011 Hallux valgus (acquired), right foot: Secondary | ICD-10-CM

## 2018-04-19 DIAGNOSIS — M2012 Hallux valgus (acquired), left foot: Secondary | ICD-10-CM | POA: Diagnosis not present

## 2018-04-19 DIAGNOSIS — H1010 Acute atopic conjunctivitis, unspecified eye: Secondary | ICD-10-CM | POA: Insufficient documentation

## 2018-04-19 DIAGNOSIS — J0100 Acute maxillary sinusitis, unspecified: Secondary | ICD-10-CM | POA: Insufficient documentation

## 2018-04-19 NOTE — Progress Notes (Signed)
   Subjective:    Patient ID: Holly Colon, female    DOB: [birth year], 52 y.o.   MRN: [id number]  HPI    Review of Systems  All other systems reviewed and are negative.      Objective:   Physical Exam        Assessment & Plan:

## 2018-04-19 NOTE — Patient Instructions (Signed)

## 2018-04-21 NOTE — Progress Notes (Signed)
Subjective:   Patient ID: Holly Colon, female   DOB: 52 y.o.   MRN: [id number]   HPI Patient presents with painful bunion deformities right over left that she knows she needs to get corrected.  Is been going on for over a year and she is wearing shoes that are trying to compensate for it but it still very tender when pressed and making it hard to walk comfortably.  Patient does not smoke and likes to be active   Review of Systems  All other systems reviewed and are negative.       Objective:  Physical Exam Vitals signs and nursing note reviewed.  Constitutional:      Appearance: She is well-developed.  Pulmonary:     Effort: Pulmonary effort is normal.  Musculoskeletal: Normal range of motion.  Skin:    General: Skin is warm.  Neurological:     Mental Status: She is alert.     Neurovascular status intact muscle strength is adequate range of motion within normal limits with patient noted to have hyperostosis medial aspect first metatarsal head right with redness around the joint surface and pain with palpation with moderate deformity also noted of the left foot.  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  Patient wants to get this done but needs to look at her schedule to decide the best time and today I did inform her on the procedure that will be appropriate for her.  All kinds of information was disseminated to the patient and she is encouraged to call with questions prior to procedure  X-rays indicate structural bunion deformity with elevation of the intermetatarsal angle right over left

## 2018-04-23 ENCOUNTER — Telehealth: Payer: Self-pay | Admitting: *Deleted

## 2018-04-23 NOTE — Telephone Encounter (Signed)
"  I was in the office on Friday of last week.  I saw him about a bunion on my foot.  He told me to call you to schedule my surgery.  I just want to get with you and see what the earliest dates and times are to schedule it.  Please give me a call."

## 2018-04-24 NOTE — Telephone Encounter (Signed)
"  I'm calling to schedule my surgery with Dr. Paulla Dolly.  I saw him last week.  Can he do it before the end of the year?  I have some flexible spending money I need to use."  He does not have anymore time this year.  "What's the soonest he can do it?"  He can do it on January 21.  "Put me down for then.  What time will I need to be there?"  Someone from the surgical center will call you the Friday or Monday before your surgery date and they will give you your arrival time.  Have you signed your consent forms?  "I have not signed anything."  You will need to schedule an appointment with Dr. Paulla Dolly for a consultation.  "He didn't tell me that, he just told me to call you."  Yes, I understand but he will need to see you to sign your consent forms and to get your surgical kit.  Would you like me to transfer you to an appointment scheduler so you can make an appointment with Dr. Paulla Dolly?  "Yes, that will be fine."  I transferred her to [facility].  Estill Bamberg came to me and stated the patient said she wanted to wait until February.  She chose 06/18/2018 as her surgery date.

## 2018-05-13 DIAGNOSIS — Z0000 Encounter for general adult medical examination without abnormal findings: Secondary | ICD-10-CM | POA: Diagnosis not present

## 2018-05-13 DIAGNOSIS — Z1389 Encounter for screening for other disorder: Secondary | ICD-10-CM | POA: Diagnosis not present

## 2018-05-13 DIAGNOSIS — R7309 Other abnormal glucose: Secondary | ICD-10-CM | POA: Diagnosis not present

## 2018-05-13 DIAGNOSIS — E782 Mixed hyperlipidemia: Secondary | ICD-10-CM | POA: Diagnosis not present

## 2018-05-13 DIAGNOSIS — G40909 Epilepsy, unspecified, not intractable, without status epilepticus: Secondary | ICD-10-CM | POA: Diagnosis not present

## 2018-05-19 DIAGNOSIS — R05 Cough: Secondary | ICD-10-CM | POA: Diagnosis not present

## 2018-05-19 DIAGNOSIS — J019 Acute sinusitis, unspecified: Secondary | ICD-10-CM | POA: Diagnosis not present

## 2018-05-27 ENCOUNTER — Encounter (HOSPITAL_COMMUNITY): Payer: Self-pay | Admitting: Emergency Medicine

## 2018-05-27 ENCOUNTER — Emergency Department (HOSPITAL_COMMUNITY): Payer: BLUE CROSS/BLUE SHIELD

## 2018-05-27 ENCOUNTER — Emergency Department (HOSPITAL_COMMUNITY)
Admission: EM | Admit: 2018-05-27 | Discharge: 2018-05-27 | Disposition: A | Discharge status: Home / Self Care | Payer: BLUE CROSS/BLUE SHIELD | Attending: Emergency Medicine | Admitting: Emergency Medicine

## 2018-05-27 DIAGNOSIS — Y929 Unspecified place or not applicable: Secondary | ICD-10-CM | POA: Diagnosis not present

## 2018-05-27 DIAGNOSIS — Z7982 Long term (current) use of aspirin: Secondary | ICD-10-CM | POA: Insufficient documentation

## 2018-05-27 DIAGNOSIS — S46212A Strain of muscle, fascia and tendon of other parts of biceps, left arm, initial encounter: Secondary | ICD-10-CM | POA: Diagnosis not present

## 2018-05-27 DIAGNOSIS — X58XXXA Exposure to other specified factors, initial encounter: Secondary | ICD-10-CM | POA: Insufficient documentation

## 2018-05-27 DIAGNOSIS — Y939 Activity, unspecified: Secondary | ICD-10-CM | POA: Insufficient documentation

## 2018-05-27 DIAGNOSIS — R0602 Shortness of breath: Secondary | ICD-10-CM | POA: Diagnosis not present

## 2018-05-27 DIAGNOSIS — R0789 Other chest pain: Secondary | ICD-10-CM | POA: Diagnosis not present

## 2018-05-27 DIAGNOSIS — Y999 Unspecified external cause status: Secondary | ICD-10-CM | POA: Diagnosis not present

## 2018-05-27 DIAGNOSIS — Z79899 Other long term (current) drug therapy: Secondary | ICD-10-CM | POA: Insufficient documentation

## 2018-05-27 LAB — CBC
HCT: 42.4 % (ref 36.0–46.0)
HEMOGLOBIN: 13.7 g/dL (ref 12.0–15.0)
MCH: 31.6 pg (ref 26.0–34.0)
MCHC: 32.3 g/dL (ref 30.0–36.0)
MCV: 97.9 fL (ref 80.0–100.0)
PLATELETS: 261 10*3/uL (ref 150–400)
RBC: 4.33 MIL/uL (ref 3.87–5.11)
RDW: 11.5 % (ref 11.5–15.5)
WBC: 3 10*3/uL — ABNORMAL LOW (ref 4.0–10.5)
nRBC: 0 % (ref 0.0–0.2)

## 2018-05-27 LAB — BASIC METABOLIC PANEL
Anion gap: 8 (ref 5–15)
BUN: 12 mg/dL (ref 6–20)
CALCIUM: 9.5 mg/dL (ref 8.9–10.3)
CO2: 25 mmol/L (ref 22–32)
CREATININE: 1.03 mg/dL — AB (ref 0.44–1.00)
Chloride: 106 mmol/L (ref 98–111)
GFR calc Af Amer: >60 mL/min (ref >60)
GLUCOSE: 97 mg/dL (ref 70–99)
Potassium: 4 mmol/L (ref 3.5–5.1)
Sodium: 139 mmol/L (ref 135–145)

## 2018-05-27 LAB — I-STAT TROPONIN, ED
TROPONIN I, POC: 0 ng/mL (ref 0.00–0.08)
Troponin i, poc: 0 ng/mL (ref 0.00–0.08)

## 2018-05-27 LAB — D-DIMER, QUANTITATIVE (NOT AT ARMC)

## 2018-05-27 LAB — I-STAT BETA HCG BLOOD, ED (MC, WL, AP ONLY): I-stat hCG, quantitative: <5 m[IU]/mL (ref <5)

## 2018-05-27 MED ORDER — SODIUM CHLORIDE 0.9% FLUSH: 3.0000 mL | Freq: Once | INTRAVENOUS | Status: DC

## 2018-05-27 NOTE — ED Provider Notes (Signed)
[facility] EMERGENCY DEPARTMENT Provider Note   CSN: [id number] Arrival date & time: 05/27/18  1044     History   Chief Complaint Chief Complaint  Patient presents with  . Shortness of Breath    HPI Holly Colon is a 53 y.o. female.  53 yo F with a chief complaint of chest pain and left arm pain.  The arm pain started yesterday and the chest pain was noted today.  Describes it is sharp.  Nothing seems to make it better or worse in the chest.  The arm pain is worse with movement or palpation or twisting.  She denies trauma.  Patient recently had a upper respiratory illness and is still continuing to cough though she thinks it significantly better.  She denies hemoptysis denies unilateral lower extremity edema denies history of PE or DVT denies estrogen use denies history of cancer.  Denies any indwelling lines to the left upper extremity.  The history is provided by the patient.  Shortness of Breath  Associated symptoms: chest pain   Associated symptoms: no fever, no headaches, no vomiting and no wheezing   Illness  Severity:  Mild Onset quality:  Gradual Duration:  2 days Timing:  Constant Progression:  Worsening Chronicity:  New Associated symptoms: chest pain   Associated symptoms: no congestion, no fever, no headaches, no myalgias, no nausea, no rhinorrhea, no shortness of breath, no vomiting and no wheezing     Past Medical History:  Diagnosis Date  . High cholesterol   . Lumbar disc disease   . Osteopenia 2015  . Seizures (HCC)   . Syncope 2003  . Vitamin D deficiency     Patient Active Problem List   Diagnosis Date Noted  . Acute maxillary sinusitis 04/19/2018  . Allergic conjunctivitis 04/19/2018  . Lumbar radiculopathy 01/22/2018  . Pain of joint of left ankle and foot 12/11/2017  . Primary generalized epilepsy (HCC) 04/16/2013    History reviewed. No pertinent surgical history.   OB History   No obstetric history on  file.      Home Medications    Prior to Admission medications   Medication Sig Start Date End Date Taking? Authorizing Provider  aspirin 81 MG tablet Take 81 mg by mouth daily.    [provider]  atorvastatin (LIPITOR) 20 MG tablet Take 20 mg by mouth daily.    [provider]  Calcium Carbonate-Vitamin D (CALCIUM-VITAMIN D) 500-200 MG-UNIT tablet Take 1 tablet by mouth daily.    [provider]  Multiple Vitamins-Minerals (MULTIVITAMIN WITH MINERALS) tablet Take 1 tablet by mouth daily.    [provider]  Vitamin D, Cholecalciferol, 1000 units CAPS Take 1 tablet by mouth daily.    [provider]  zonisamide (ZONEGRAN) 100 MG capsule Take 3 capsules (300 mg total) by mouth daily. 05/26/14   Penumalli, Glenford Bayley, MD    Family History Family History  Problem Relation Age of Onset  . Heart attack Father   . Stroke Father   . CVA Father   . Peripheral vascular disease Father     Social History Social History   Tobacco Use  . Smoking status: Never Smoker  . Smokeless tobacco: Never Used  Substance Use Topics  . Alcohol use: No  . Drug use: No     Allergies   Patient has no known allergies.   Review of Systems Review of Systems  Constitutional: Negative for chills and fever.  HENT: Negative for congestion  and rhinorrhea.   Eyes: Negative for redness and visual disturbance.  Respiratory: Negative for shortness of breath and wheezing.   Cardiovascular: Positive for chest pain. Negative for palpitations.  Gastrointestinal: Negative for nausea and vomiting.  Genitourinary: Negative for dysuria and urgency.  Musculoskeletal: Positive for arthralgias. Negative for myalgias.  Skin: Negative for pallor and wound.  Neurological: Negative for dizziness and headaches.     Physical Exam Updated Vital Signs BP 138/84 (BP Location: Right Arm)   Pulse 72   Temp 98.2 F (36.8 C) (Oral)   Resp 18   SpO2 100%   Physical  Exam Vitals signs and nursing note reviewed.  Constitutional:      General: She is not in acute distress.    Appearance: She is well-developed. She is not diaphoretic.  HENT:     Head: Normocephalic and atraumatic.  Eyes:     Pupils: Pupils are equal, round, and reactive to light.  Neck:     Musculoskeletal: Normal range of motion and neck supple.  Cardiovascular:     Rate and Rhythm: Normal rate and regular rhythm.     Heart sounds: No murmur. No friction rub. No gallop.   Pulmonary:     Effort: Pulmonary effort is normal.     Breath sounds: No wheezing or rales.  Chest:     Comments: No palpable chest wall tenderness Abdominal:     General: There is no distension.     Palpations: Abdomen is soft.     Tenderness: There is no abdominal tenderness.  Musculoskeletal:        General: No tenderness.     Comments: Tenderness about the head of the bicep.  Full range of motion of the elbow and the shoulder without significant tenderness.  No appreciable edema.  Pulse motor and sensation intact distally.  Skin:    General: Skin is warm and dry.  Neurological:     Mental Status: She is alert and oriented to person, place, and time.  Psychiatric:        Behavior: Behavior normal.      ED Treatments / Results  Labs (all labs ordered are listed, but only abnormal results are displayed) Labs Reviewed  BASIC METABOLIC PANEL - Abnormal; Notable for the following components:      Result Value   Creatinine, Ser 1.03 (*)    All other components within normal limits  CBC - Abnormal; Notable for the following components:   WBC 3.0 (*)    All other components within normal limits  D-DIMER, QUANTITATIVE (NOT AT [facility])  I-STAT TROPONIN, ED  I-STAT BETA HCG BLOOD, ED ([facility], [facility], [facility] ONLY)  I-STAT TROPONIN, ED    EKG EKG Interpretation  Date/Time:  Monday May 27 2018 10:52:43 EST Ventricular Rate:  74 PR Interval:  162 QRS Duration: 74 QT Interval:  382 QTC Calculation: 424 R  Axis:   74 Text Interpretation:  Sinus rhythm with occasional Premature ventricular complexes Anteroseptal infarct , age undetermined Abnormal ECG Otherwise no significant change Confirmed by Melene PlanFloyd, Makeya Hilgert [phone]([id number]) on 05/27/2018 11:50:58 AM   Radiology Dg Chest 2 View  Result Date: 05/27/2018 CLINICAL DATA:  [age] year old female with a history of left arm pain and shortness of breath EXAM: CHEST - 2 VIEW COMPARISON:  07/15/2015 FINDINGS: The heart size and mediastinal contours are within normal limits. Both lungs are clear. The visualized skeletal structures are unremarkable. IMPRESSION: Negative for acute cardiopulmonary disease Electronically Signed   By: Gilmer MorJaime  Wagner D.O.  On: 05/27/2018 11:15    Procedures Procedures (including critical care time)  Medications Ordered in ED Medications  sodium chloride flush (NS) 0.9 % injection 3 mL (has no administration in time range)     Initial Impression / Assessment and Plan / ED Course  I have reviewed the triage vital signs and the nursing notes.  Pertinent labs & imaging results that were available during my care of the patient were reviewed by me and considered in my medical decision making (see chart for details).     53 yo F with a chief complaint of chest pain and left arm pain.  I suspect that the left arm pain is muscular strain.  I do not appreciate significant amount of edema.  She is also complaining of atypical chest pain that sharp and seems to come and go.  I suspect that all this is related to her recent URI.  Will obtain delta troponin will obtain a d-dimer.  Ddimer negative, delta trop negative, d/c home.   3:39 PM:  I have discussed the diagnosis/risks/treatment options with the patient and believe the pt to be eligible for discharge home to follow-up with PCP. We also discussed returning to the ED immediately if new or worsening sx occur. We discussed the sx which are most concerning (e.g., sudden worsening pain, fever,  inability to tolerate by mouth) that necessitate immediate return. Medications administered to the patient during their visit and any new prescriptions provided to the patient are listed below.  Medications given during this visit Medications  sodium chloride flush (NS) 0.9 % injection 3 mL (has no administration in time range)     The patient appears reasonably screen and/or stabilized for discharge and I doubt any other medical condition or other [facility] requiring further screening, evaluation, or treatment in the ED at this time prior to discharge.    Final Clinical Impressions(s) / ED Diagnoses   Final diagnoses:  Atypical chest pain  Biceps strain, left, initial encounter    ED Discharge Orders    None       Melene PlanFloyd, Sherol Sabas, DO 05/27/18 1539

## 2018-05-27 NOTE — ED Triage Notes (Signed)
Pt reports yesterday while she was standing in church she began to have left arm pain in bicep and felt sob. Pt states this morning pain In left arm was worse.

## 2018-05-27 NOTE — Discharge Instructions (Signed)
Take 4 over the counter ibuprofen tablets 3 times a day or 2 over-the-counter naproxen tablets twice a day for pain. Also take tylenol 1000mg(2 extra strength) four times a day.    

## 2018-05-27 NOTE — ED Notes (Signed)
Xray to bring pt to treatment room

## 2018-05-27 NOTE — ED Notes (Signed)
Patient verbalizes understanding of discharge instructions. Opportunity for questioning and answers were provided. Armband removed by staff, pt discharged from ED ambulatory to home.  

## 2018-06-06 ENCOUNTER — Encounter: Payer: Self-pay | Admitting: Podiatry

## 2018-06-06 ENCOUNTER — Telehealth: Payer: Self-pay | Admitting: *Deleted

## 2018-06-06 ENCOUNTER — Ambulatory Visit: Payer: BLUE CROSS/BLUE SHIELD | Admitting: Podiatry

## 2018-06-06 DIAGNOSIS — M2012 Hallux valgus (acquired), left foot: Secondary | ICD-10-CM | POA: Diagnosis not present

## 2018-06-06 DIAGNOSIS — M2011 Hallux valgus (acquired), right foot: Secondary | ICD-10-CM

## 2018-06-06 NOTE — Telephone Encounter (Signed)
"  Patient put BILATERAL bunions;, but we have RIGHT. Can you verify?" Thanks,  Kellie Moor RN [facility] Pre Anesthesia Assessment Nurse ------------------------------------------------------------------------------------------------------------------------------------------------------------  I called the patient but realized that she's here at our office.  I went and asked her.  She said she was considering to have both done at one time but decided against it.  She said she was having the right foot done now.  I informed Aram Beecham at the surgical center that she wanted to have the right foot only at this time.

## 2018-06-06 NOTE — Patient Instructions (Signed)
Pre-Operative Instructions  Congratulations, you have decided to take an important step towards improving your quality of life.  You can be assured that the doctors and staff at [facility] will be with you every step of the way.  Here are some important things you should know:  1. Plan to be at the surgery center/hospital at least 1 (one) hour prior to your scheduled time, unless otherwise directed by the surgical center/hospital staff.  You must have a responsible adult accompany you, remain during the surgery and drive you home.  Make sure you have directions to the surgical center/hospital to ensure you arrive on time. 2. If you are having surgery at [facility] or [facility], you will need a copy of your medical history and physical form from your family physician within one month prior to the date of surgery. We will give you a form for your primary physician to complete.  3. We make every effort to accommodate the date you request for surgery.  However, there are times where surgery dates or times have to be moved.  We will contact you as soon as possible if a change in schedule is required.   4. No aspirin/ibuprofen for one week before surgery.  If you are on aspirin, any non-steroidal anti-inflammatory medications (Mobic, Aleve, Ibuprofen) should not be taken seven (7) days prior to your surgery.  You make take Tylenol for pain prior to surgery.  5. Medications - If you are taking daily heart and blood pressure medications, seizure, reflux, allergy, asthma, anxiety, pain or diabetes medications, make sure you notify the surgery center/hospital before the day of surgery so they can tell you which medications you should take or avoid the day of surgery. 6. No food or drink after midnight the night before surgery unless directed otherwise by surgical center/hospital staff. 7. No alcoholic beverages 24-hours prior to surgery.  No smoking 24-hours prior or 24-hours after  surgery. 8. Wear loose pants or shorts. They should be loose enough to fit over bandages, boots, and casts. 9. Don't wear slip-on shoes. Sneakers are preferred. 10. Bring your boot with you to the surgery center/hospital.  Also bring crutches or a walker if your physician has prescribed it for you.  If you do not have this equipment, it will be provided for you after surgery. 11. If you have not been contacted by the surgery center/hospital by the day before your surgery, call to confirm the date and time of your surgery. 12. Leave-time from work may vary depending on the type of surgery you have.  Appropriate arrangements should be made prior to surgery with your employer. 13. Prescriptions will be provided immediately following surgery by your doctor.  Fill these as soon as possible after surgery and take the medication as directed. Pain medications will not be refilled on weekends and must be approved by the doctor. 14. Remove nail polish on the operative foot and avoid getting pedicures prior to surgery. 15. Wash the night before surgery.  The night before surgery wash the foot and leg well with water and the antibacterial soap provided. Be sure to pay special attention to beneath the toenails and in between the toes.  Wash for at least three (3) minutes. Rinse thoroughly with water and dry well with a towel.  Perform this wash unless told not to do so by your physician.  Enclosed: 1 Ice pack (please put in freezer the night before surgery)   1 Hibiclens skin cleaner     Pre-op instructions  If you have any questions regarding the instructions, please do not hesitate to call our office.  [place]: [address], [place], [place] [address] -- [phone]  [place]: [address] [place], [place] [address] -- [phone]  [place]: [address]  [place], [place] [address] -- [phone]  [place]: [address], [address], [place], [place] [address] -- [phone]  Website:  [email or web address] 

## 2018-06-07 NOTE — Progress Notes (Signed)
Subjective:   Patient ID: Holly Colon, female   DOB: 53 y.o.   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  I do think one at a time will do better and she agrees after review and I explained the procedure risk and the fact that she will be in a boot for several weeks with surgical shoe to follow in the total recovery will take approximately 6 months.  Patient read consent form going over alternative treatments complications and after reviewing signed consent form understanding risk and today I dispensed air fracture walker for the postoperative.  And encouraged her to call us with any questions concerns she may have prior to the procedure

## 2018-06-18 ENCOUNTER — Encounter: Payer: Self-pay | Admitting: Podiatry

## 2018-06-18 ENCOUNTER — Telehealth: Payer: Self-pay | Admitting: Podiatry

## 2018-06-18 DIAGNOSIS — E7800 Pure hypercholesterolemia, unspecified: Secondary | ICD-10-CM | POA: Diagnosis not present

## 2018-06-18 DIAGNOSIS — M21611 Bunion of right foot: Secondary | ICD-10-CM | POA: Diagnosis not present

## 2018-06-18 DIAGNOSIS — M2011 Hallux valgus (acquired), right foot: Secondary | ICD-10-CM | POA: Diagnosis not present

## 2018-06-18 NOTE — Telephone Encounter (Signed)
I spoke with pt, she asked if she was to get percocet, she had zofran for nausea but not having nausea. I told pt the percocet should have been at the pharmacy. Pt states she will contact her mtr and have her check the pharmacy.

## 2018-06-18 NOTE — Telephone Encounter (Signed)
Left message requesting a call to discuss Holly Colon.

## 2018-06-18 NOTE — Telephone Encounter (Signed)
My daughter had surgery this morning and I have a question about some of the medications. If you would please call me back. Thank you.

## 2018-06-18 NOTE — Telephone Encounter (Signed)
I called in reference to my daughter and I think someone tried calling me. If you would, please call me back. Thank you.

## 2018-06-27 ENCOUNTER — Ambulatory Visit (INDEPENDENT_AMBULATORY_CARE_PROVIDER_SITE_OTHER): Payer: BLUE CROSS/BLUE SHIELD

## 2018-06-27 ENCOUNTER — Ambulatory Visit (INDEPENDENT_AMBULATORY_CARE_PROVIDER_SITE_OTHER): Payer: BLUE CROSS/BLUE SHIELD | Admitting: Podiatry

## 2018-06-27 DIAGNOSIS — M2012 Hallux valgus (acquired), left foot: Secondary | ICD-10-CM | POA: Diagnosis not present

## 2018-06-27 DIAGNOSIS — Z09 Encounter for follow-up examination after completed treatment for conditions other than malignant neoplasm: Secondary | ICD-10-CM

## 2018-06-27 DIAGNOSIS — M2011 Hallux valgus (acquired), right foot: Secondary | ICD-10-CM

## 2018-06-27 NOTE — Progress Notes (Signed)
She presents today for follow-up of her Holly Colon right date of surgery June 18, 2018 she denies fever chills nausea vomiting muscle aches pains calf pain back pain chest pain shortness of breath states that he seems to be doing fine.  Objective: Vital signs are stable she is alert oriented x3 dry sterile dressing intact Cam boot was in place.  With the dressing removed there is no erythematous mild edema no cellulitis drainage or odor incision site appears to be healing very nicely she has good range of motion both passive and active.  Radiographs taken today demonstrate capital osteotomy of the first metatarsal with double K wire fixation in good position.  Good joint alignment.  No fractures identified.  Assessment: Well-healing surgical foot.  Plan: Follow-up with Dr. Charlsie Merles in 1 week.  Redressed the foot today dressed a compressive dressing.  Continue the use of the [place] shoe and cam walker.  Continue to keep dry and elevated.  Encouraged active and passive range of motion.

## 2018-07-10 ENCOUNTER — Encounter: Payer: Self-pay | Admitting: Podiatry

## 2018-07-10 ENCOUNTER — Ambulatory Visit (INDEPENDENT_AMBULATORY_CARE_PROVIDER_SITE_OTHER): Payer: BLUE CROSS/BLUE SHIELD

## 2018-07-10 ENCOUNTER — Ambulatory Visit (INDEPENDENT_AMBULATORY_CARE_PROVIDER_SITE_OTHER): Payer: BLUE CROSS/BLUE SHIELD | Admitting: Podiatry

## 2018-07-10 DIAGNOSIS — M2011 Hallux valgus (acquired), right foot: Secondary | ICD-10-CM

## 2018-07-10 DIAGNOSIS — M2012 Hallux valgus (acquired), left foot: Principal | ICD-10-CM

## 2018-07-10 DIAGNOSIS — Z09 Encounter for follow-up examination after completed treatment for conditions other than malignant neoplasm: Secondary | ICD-10-CM

## 2018-07-10 NOTE — Progress Notes (Signed)
Subjective:   Patient ID: Holly Colon, female   DOB: 53 y.o.   MRN: [id number]   HPI Patient states overall I am doing well but will throb if I am on it for too long of a time   ROS      Objective:  Physical Exam  Neurovascular status intact with patient's right foot healing well wound edges well coapted mild forefoot edema good range of motion with no crepitus of the joint     Assessment:  Overall doing well with foot structure right after osteotomy with good healing and mild edema consistent with the postoperative.     Plan:  H&P condition reviewed and recommended continuation of range of motion exercises compression therapy elevation and dispensed ankle compression stocking.  Reappoint 4 weeks or earlier if needed  X-rays indicate osteotomies healing well fixation looks good good correction of the osteotomy and the fixation is in place

## 2018-08-07 ENCOUNTER — Encounter: Payer: BLUE CROSS/BLUE SHIELD | Admitting: Podiatry

## 2019-03-07 ENCOUNTER — Other Ambulatory Visit: Payer: Self-pay

## 2019-03-07 DIAGNOSIS — Z20822 Contact with and (suspected) exposure to covid-19: Secondary | ICD-10-CM

## 2019-03-09 LAB — NOVEL CORONAVIRUS, NAA: SARS-CoV-2, NAA: NOT DETECTED

## 2019-05-28 ENCOUNTER — Other Ambulatory Visit: Payer: Self-pay | Admitting: Internal Medicine

## 2019-05-28 DIAGNOSIS — Z1231 Encounter for screening mammogram for malignant neoplasm of breast: Secondary | ICD-10-CM

## 2019-05-29 ENCOUNTER — Other Ambulatory Visit: Payer: Self-pay

## 2019-05-29 ENCOUNTER — Ambulatory Visit
Admission: RE | Admit: 2019-05-29 | Discharge: 2019-05-29 | Disposition: A | Discharge status: Home / Self Care | Payer: 59 | Source: Ambulatory Visit

## 2019-05-29 DIAGNOSIS — Z1231 Encounter for screening mammogram for malignant neoplasm of breast: Secondary | ICD-10-CM

## 2019-05-30 ENCOUNTER — Ambulatory Visit: Payer: 59 | Attending: Internal Medicine

## 2019-05-30 DIAGNOSIS — Z20822 Contact with and (suspected) exposure to covid-19: Secondary | ICD-10-CM

## 2019-05-31 LAB — NOVEL CORONAVIRUS, NAA: SARS-CoV-2, NAA: NOT DETECTED

## 2020-01-02 DIAGNOSIS — R202 Paresthesia of skin: Secondary | ICD-10-CM | POA: Diagnosis not present

## 2020-01-23 ENCOUNTER — Ambulatory Visit
Admission: RE | Admit: 2020-01-23 | Discharge: 2020-01-23 | Disposition: A | Discharge status: Home / Self Care | Payer: 59 | Source: Ambulatory Visit | Attending: Internal Medicine | Admitting: Internal Medicine

## 2020-01-23 ENCOUNTER — Other Ambulatory Visit: Payer: Self-pay | Admitting: Internal Medicine

## 2020-01-23 DIAGNOSIS — R202 Paresthesia of skin: Secondary | ICD-10-CM | POA: Diagnosis not present

## 2020-01-23 DIAGNOSIS — R0789 Other chest pain: Secondary | ICD-10-CM

## 2020-01-23 DIAGNOSIS — R0609 Other forms of dyspnea: Secondary | ICD-10-CM | POA: Diagnosis not present

## 2020-01-23 DIAGNOSIS — R238 Other skin changes: Secondary | ICD-10-CM | POA: Diagnosis not present

## 2020-02-07 IMAGING — DX DG CHEST 2V
2 series · 2 of 2 positions shown · non-contrast
Comparison: 07/15/2015

CLINICAL DATA: 53-year-old female with a history of left arm pain
and shortness of breath

EXAM:
CHEST - 2 VIEW

[chest pa]
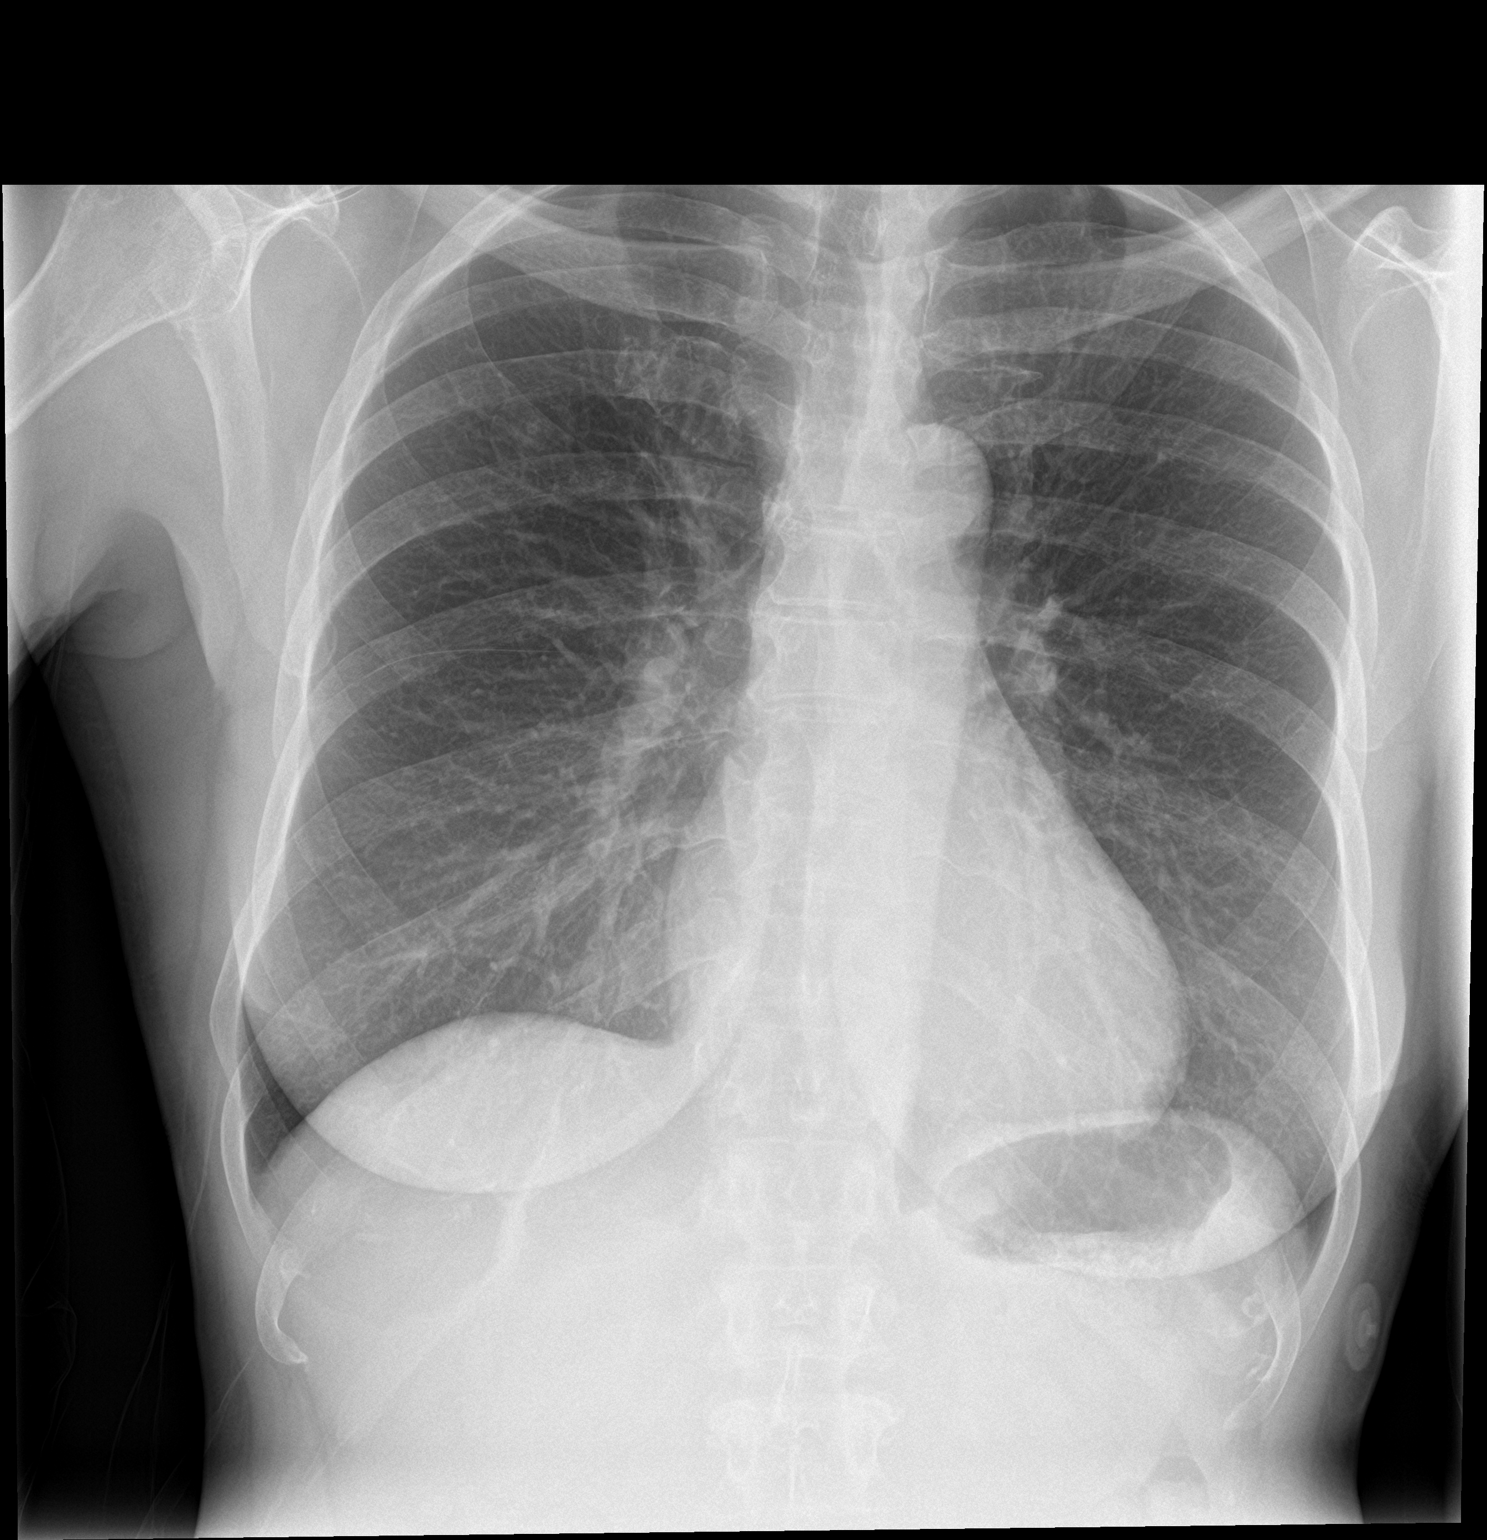

[chest lat]
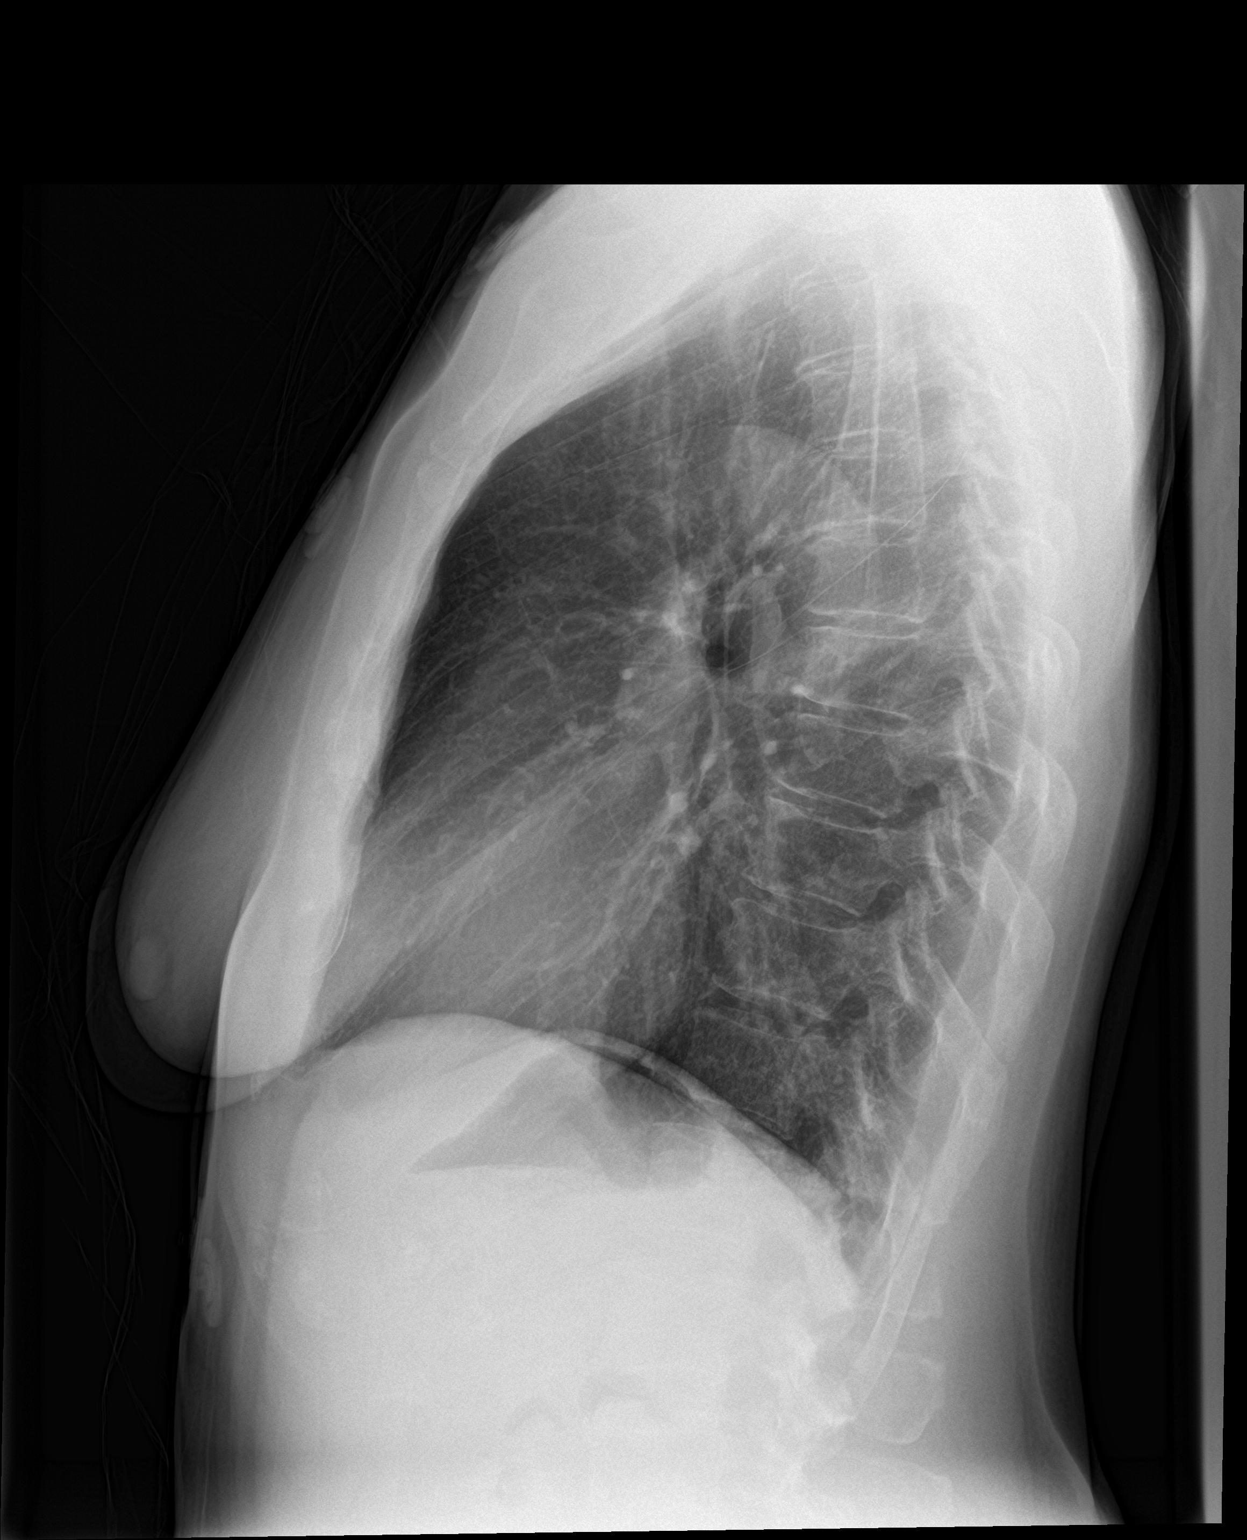

[2 of 2 positions shown; findings below may reference images not displayed]

FINDINGS: The heart size and mediastinal contours are within normal limits.
Both lungs are clear. The visualized skeletal structures are
unremarkable.
IMPRESSION: Negative for acute cardiopulmonary disease

## 2020-02-27 DIAGNOSIS — R1032 Left lower quadrant pain: Secondary | ICD-10-CM | POA: Diagnosis not present

## 2020-02-27 DIAGNOSIS — N949 Unspecified condition associated with female genital organs and menstrual cycle: Secondary | ICD-10-CM | POA: Diagnosis not present

## 2020-10-25 ENCOUNTER — Other Ambulatory Visit: Payer: Self-pay | Admitting: Obstetrics and Gynecology

## 2020-10-25 DIAGNOSIS — Z1231 Encounter for screening mammogram for malignant neoplasm of breast: Secondary | ICD-10-CM

## 2020-10-26 ENCOUNTER — Ambulatory Visit
Admission: RE | Admit: 2020-10-26 | Discharge: 2020-10-26 | Disposition: A | Discharge status: Home / Self Care | Payer: BC Managed Care – PPO | Source: Ambulatory Visit | Attending: Obstetrics and Gynecology | Admitting: Obstetrics and Gynecology

## 2020-10-26 ENCOUNTER — Other Ambulatory Visit: Payer: Self-pay

## 2020-10-26 DIAGNOSIS — Z1231 Encounter for screening mammogram for malignant neoplasm of breast: Secondary | ICD-10-CM

## 2021-10-05 IMAGING — CR DG CHEST 2V
2 series · 2 of 2 positions shown · non-contrast
Comparison: 05/27/2018

CLINICAL DATA: Chest discomfort

EXAM:
CHEST - 2 VIEW

[w chest pa]
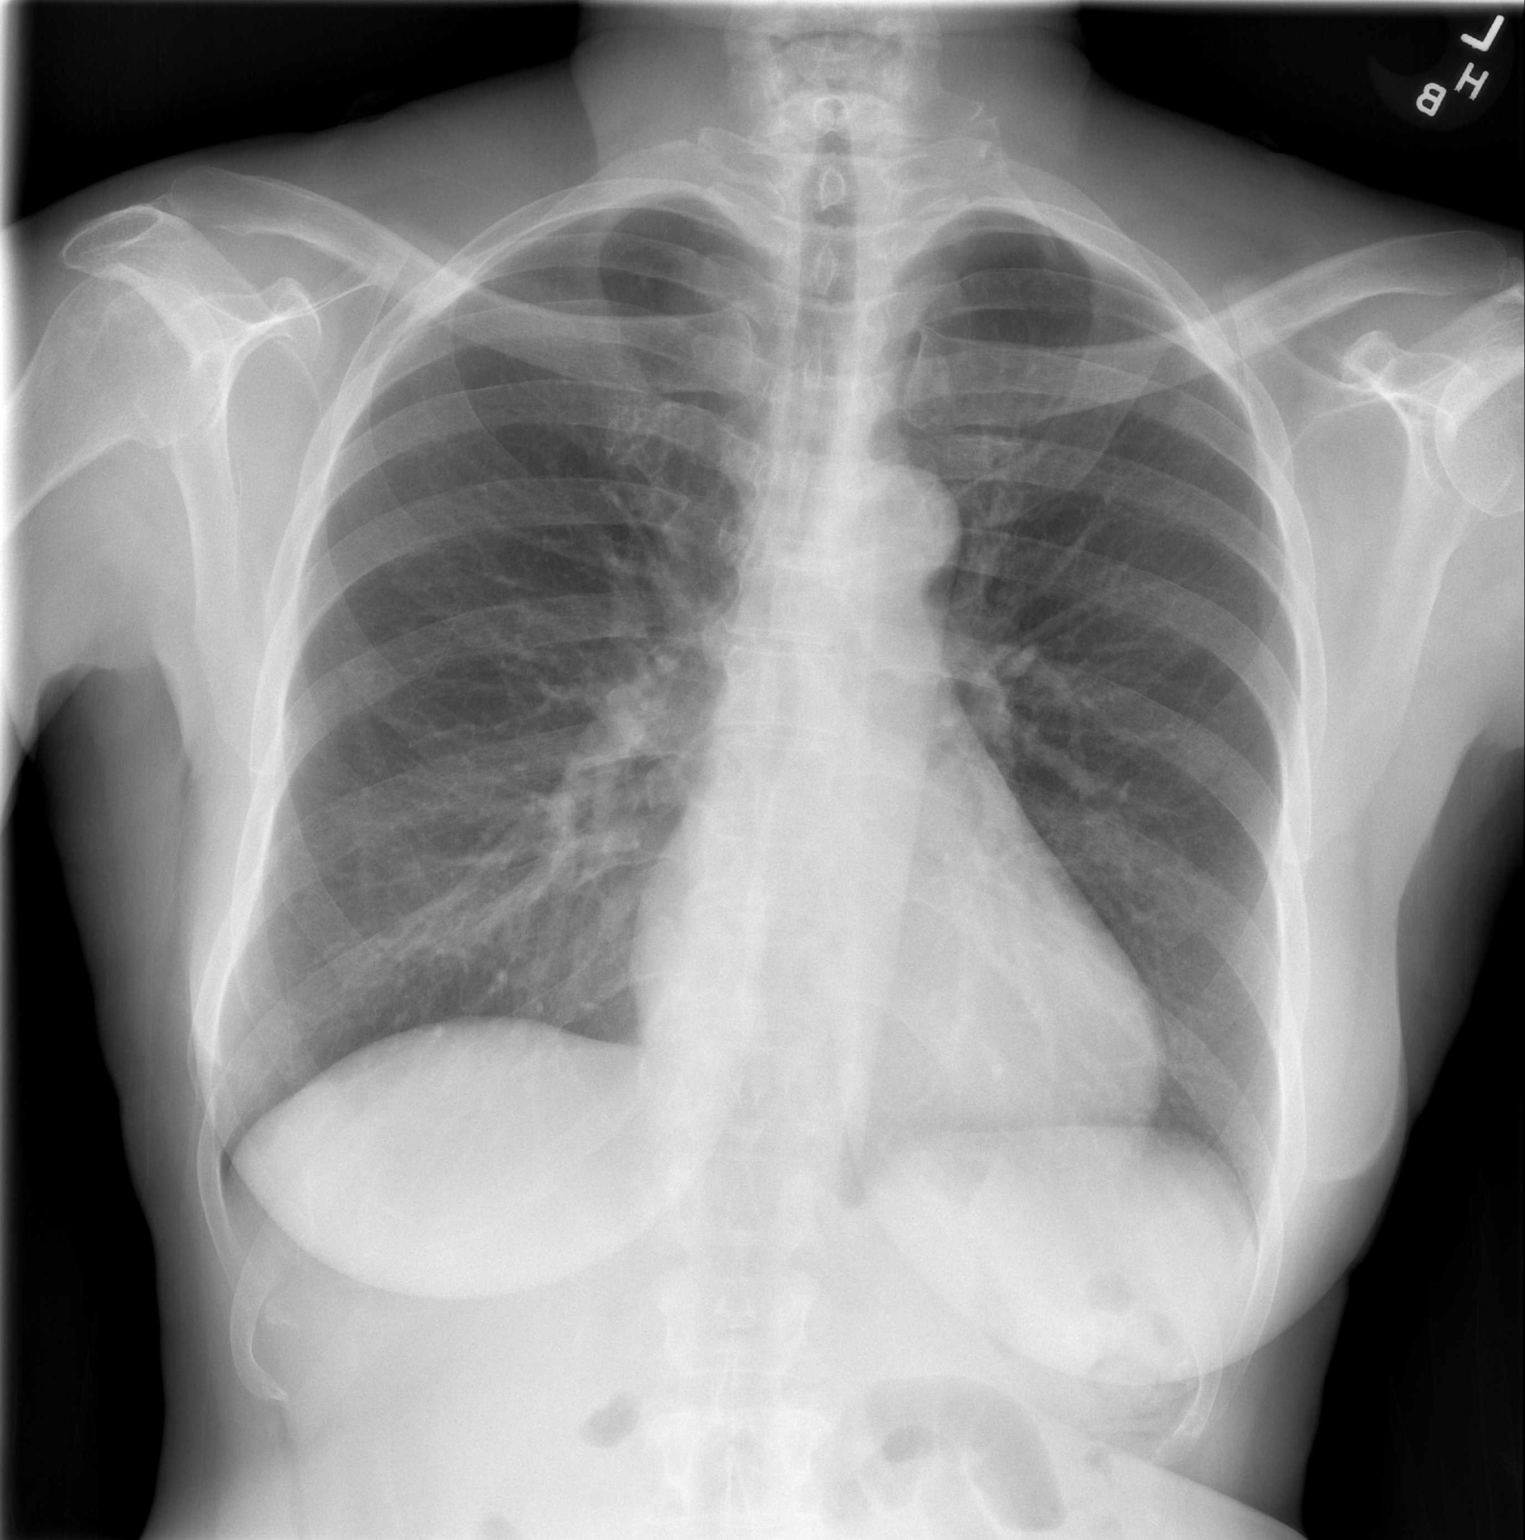

[w chest lat]
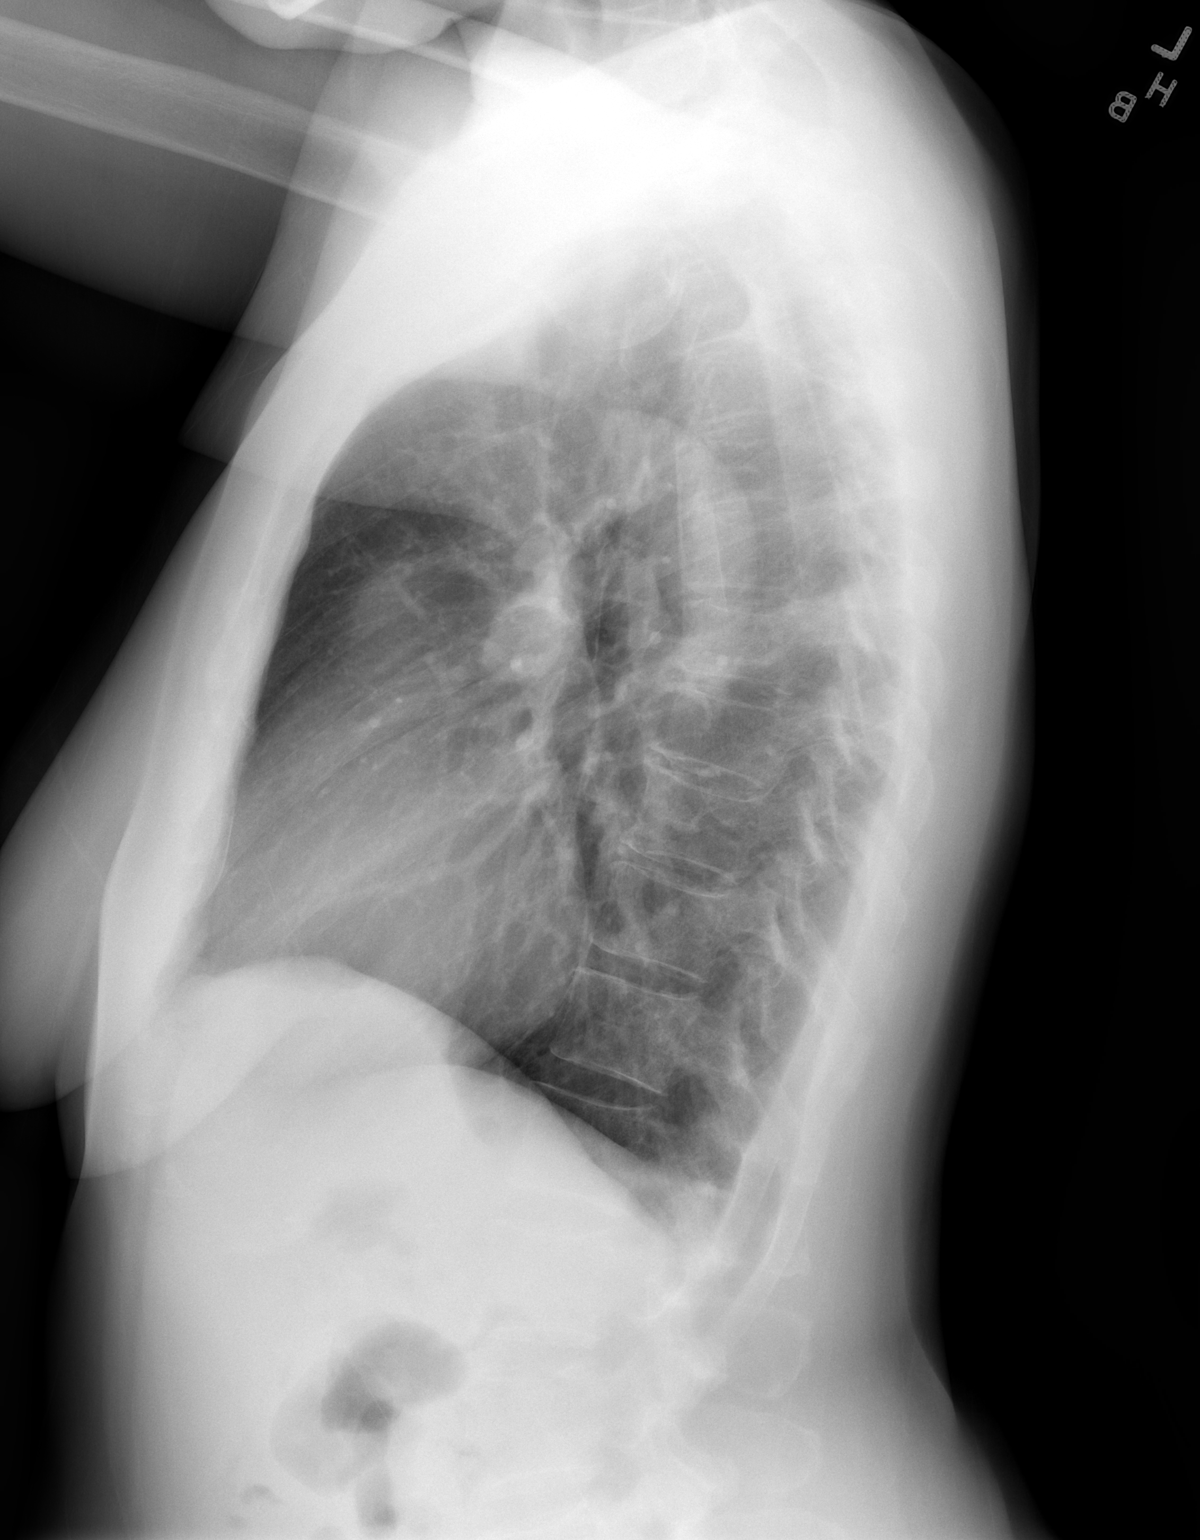

[2 of 2 positions shown; findings below may reference images not displayed]

FINDINGS: The heart size and mediastinal contours are within normal limits.
Both lungs are clear. The visualized skeletal structures are
unremarkable.
IMPRESSION: No acute abnormality of the lungs.

## 2021-11-02 ENCOUNTER — Other Ambulatory Visit: Payer: Self-pay | Admitting: Obstetrics and Gynecology

## 2021-11-02 DIAGNOSIS — Z1231 Encounter for screening mammogram for malignant neoplasm of breast: Secondary | ICD-10-CM

## 2021-11-09 ENCOUNTER — Ambulatory Visit: Payer: 59

## 2021-11-16 ENCOUNTER — Ambulatory Visit
Admission: RE | Admit: 2021-11-16 | Discharge: 2021-11-16 | Disposition: A | Discharge status: Home / Self Care | Payer: Commercial Managed Care - PPO | Source: Ambulatory Visit | Attending: Obstetrics and Gynecology | Admitting: Obstetrics and Gynecology

## 2021-11-16 DIAGNOSIS — Z1231 Encounter for screening mammogram for malignant neoplasm of breast: Secondary | ICD-10-CM

## 2021-11-17 ENCOUNTER — Other Ambulatory Visit: Payer: Self-pay | Admitting: Internal Medicine

## 2021-11-17 DIAGNOSIS — E782 Mixed hyperlipidemia: Secondary | ICD-10-CM

## 2022-01-26 ENCOUNTER — Ambulatory Visit
Admission: RE | Admit: 2022-01-26 | Discharge: 2022-01-26 | Disposition: A | Discharge status: Home / Self Care | Payer: No Typology Code available for payment source | Source: Ambulatory Visit | Attending: Internal Medicine | Admitting: Internal Medicine

## 2022-01-26 DIAGNOSIS — E782 Mixed hyperlipidemia: Secondary | ICD-10-CM

## 2022-03-01 ENCOUNTER — Ambulatory Visit: Payer: Commercial Managed Care - PPO | Admitting: Cardiology

## 2022-03-01 ENCOUNTER — Encounter: Payer: Self-pay | Admitting: Cardiology

## 2022-03-01 VITALS — BP 134/80 | HR 67 | Resp 16 | Ht 64.0 in | Wt 195.0 lb

## 2022-03-01 DIAGNOSIS — I10 Essential (primary) hypertension: Secondary | ICD-10-CM

## 2022-03-01 DIAGNOSIS — E782 Mixed hyperlipidemia: Secondary | ICD-10-CM

## 2022-03-01 DIAGNOSIS — I2510 Atherosclerotic heart disease of native coronary artery without angina pectoris: Secondary | ICD-10-CM

## 2022-03-01 NOTE — Progress Notes (Signed)
ID:  Joyclyn Plazola, DOB 1965/12/04, MRN [id number]  PCP:  Georgann Housekeeper, MD  Cardiologist:  Tessa Lerner, DO, [facility] (established care 03/01/2022)  REASON FOR CONSULT: Coronary artery calcification  REQUESTING PHYSICIAN:  Georgann Housekeeper, MD [address] [place],  [place] [address]  Chief Complaint  Patient presents with    Coronary artery calcification seen on CAT scan   New Patient (Initial Visit)    HPI  Chyna Kneece is a 56 y.o. African-American female who presents to the clinic for evaluation of elevated calcium score at the request of Georgann Housekeeper, MD. Her past medical history and cardiovascular risk factors include: Severe coronary artery calcification (593, 99th percentile), aortic atherosclerosis, hypertension, hyperlipidemia, family history of heart disease.   Patient requested a coronary calcium score be done with her PCP.  After having a coronary calcium score she was noted to have severe CAC placing her at the 99th percentile as well as pulmonary nodules and aortic atherosclerosis.  She referred to cardiology for further evaluation and management.  Review of systems are positive for chest pain intermittently as well as shortness of breath.  Chest pain started couple weeks ago, located substernally, intensity 4 out of 10, lasting for few minutes, intermittent, at times brought on by effort related activities but not always, and self-limited.  No near-syncope or syncopal events.  No family history of premature coronary disease or sudden cardiac death.  FUNCTIONAL STATUS: Attempts to walk at least 30 minutes of every day.  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  ALLERGIES: No Known Allergies  MEDICATION LIST PRIOR TO VISIT: Current Meds  Medication Sig   amLODipine (NORVASC) 5 MG tablet Take 5 mg by mouth daily.   aspirin 81 MG tablet Take 81 mg by mouth daily.   atorvastatin (LIPITOR) 40 MG tablet Take  20 mg by mouth daily.   Multiple Vitamins-Minerals (MULTIVITAMIN WITH MINERALS) tablet Take 1 tablet by mouth daily.   NON FORMULARY Organic Powdered Maca Gelatinized   Vitamin D, Cholecalciferol, 1000 units CAPS Take 1 tablet by mouth daily.   zonisamide (ZONEGRAN) 100 MG capsule Take 3 capsules (300 mg total) by mouth daily.     PAST MEDICAL HISTORY: Past Medical History:  Diagnosis Date   Coronary artery calcification    High cholesterol    Lumbar disc disease    Osteopenia 05/08/2013   Seizures (HCC)    Syncope 05/08/2001   Vitamin D deficiency     PAST SURGICAL HISTORY: History reviewed. No pertinent surgical history.  FAMILY HISTORY: The patient family history includes CVA in her father; Heart attack in her father; Peripheral vascular disease in her father; Stroke in her father.  SOCIAL HISTORY:  The patient  reports that she has never smoked. She has never used smokeless tobacco. She reports that she does not drink alcohol and does not use drugs.  REVIEW OF SYSTEMS: Review of Systems  Cardiovascular:  Positive for chest pain (see HPI). Negative for claudication, dyspnea on exertion, irregular heartbeat, leg swelling, near-syncope, orthopnea, palpitations, paroxysmal nocturnal dyspnea and syncope.  Respiratory:  Positive for shortness of breath.   Hematologic/Lymphatic: Negative for bleeding problem.  Musculoskeletal:  Negative for muscle cramps and myalgias.       Left shoulder blade.   Neurological:  Negative for dizziness and light-headedness.   PHYSICAL EXAM:    03/01/2022    9:40 AM 05/27/2018   10:49 AM 04/19/2018   11:25 AM  Vitals with BMI  Height 5\' 4"     Weight 195 lbs    BMI 49.44    Systolic [phone]  Diastolic 80 84 72  Pulse 67 72 56    Physical Exam  Constitutional: No distress.  Age appropriate, hemodynamically stable.   Neck: No JVD present.  Cardiovascular: Normal rate, regular rhythm, S1 normal, S2 normal, intact distal pulses and  normal pulses. Exam reveals no gallop, no S3 and no S4.  No murmur heard. Pulmonary/Chest: Effort normal and breath sounds normal. No stridor. She has no wheezes. She has no rales.  Abdominal: Soft. Bowel sounds are normal. She exhibits no distension. There is no abdominal tenderness.  Musculoskeletal:        General: No edema.     Cervical back: Neck supple.  Neurological: She is alert and oriented to person, place, and time. She has intact cranial nerves (2-12).  Skin: Skin is warm and moist.   CARDIAC DATABASE: EKG: 03/01/2022: NSR 65bpm, LAE, consider anteroseptal, occasional PVC.   Echocardiogram: No results found for this or any previous visit from the past 1095 days.   Stress Testing: No results found for this or any previous visit from the past 1095 days.  Heart Catheterization: None  Calcium Score: 01/26/2022 Left Main: 0   LAD: 183   LCx: 167   RCA: 242   Total Agatston Score: 593   MESA database percentile: 99 1. Coronary artery calcium score of 593 which is percentile 99 for subjects of the same age, gender and race/ethnicity. 2. Multiple pulmonary nodules. Most significant: 6 mm left solid pulmonary nodule. Per Fleischner Society Guidelines, recommend a non-contrast Chest CT at 3-6 months. If patient is high risk for malignancy, recommend an additional non-contrast Chest CT at 18-24 months; if patient is low risk for malignancy a non-contrast Chest CT at 18-24 months is optional. These guidelines do not apply to immunocompromised patients and patients with cancer. Follow up in patients with significant comorbidities as clinically warranted. For lung cancer screening, adhere to Lung-RADS guidelines. Reference: Radiology. 2017; 284(1):228-43. 3. Aortic atherosclerosis.  LABORATORY DATA:    Latest Ref Rng & Units 05/27/2018   11:03 AM  CBC  WBC 4.0 - 10.5 K/uL 3.0   Hemoglobin 12.0 - 15.0 g/dL 13.7   Hematocrit 36.0 - 46.0 % 42.4   Platelets 150 - 400 K/uL 261         Latest Ref Rng & Units 05/27/2018   11:03 AM  CMP  Glucose 70 - 99 mg/dL 97   BUN 6 - 20 mg/dL 12   Creatinine 0.44 - 1.00 mg/dL 1.03   Sodium 135 - 145 mmol/L 139   Potassium 3.5 - 5.1 mmol/L 4.0   Chloride 98 - 111 mmol/L 106   CO2 22 - 32 mmol/L 25   Calcium 8.9 - 10.3 mg/dL 9.5     Lipid Panel  No results found for: "CHOL", "TRIG", "HDL", "CHOLHDL", "VLDL", "LDLCALC", "LDLDIRECT", "LABVLDL"  No components found for: "NTPROBNP" No results for input(s): "PROBNP" in the last 8760 hours. No results for input(s): "TSH" in the last 8760 hours.  BMP No results for input(s): "NA", "K", "CL", "CO2", "GLUCOSE", "BUN", "CREATININE", "CALCIUM", "GFRNONAA", "GFRAA" in the last 8760 hours.  HEMOGLOBIN A1C No results found for: "HGBA1C", "MPG"  IMPRESSION:    ICD-10-CM   1. Coronary artery calcification seen on CAT scan  I25.10 EKG 12-Lead    PCV MYOCARDIAL PERFUSION WO LEXISCAN    PCV ECHOCARDIOGRAM COMPLETE  2. Benign hypertension  I10     3. Mixed hyperlipidemia  E78.2        RECOMMENDATIONS: Renda Pohlman is a 56 y.o. African-American female whose past medical history and cardiac risk factors include: Severe coronary artery calcification (593, 99th percentile), aortic atherosclerosis, hypertension, hyperlipidemia, family history of heart disease.   Coronary artery calcification Total CAC 593, 99th percentile. Clinically has noncardiac chest pain and dyspnea. Continue aspirin 81 mg p.o. daily. Patient states that her atorvastatin was increased from 20 mg p.o. nightly to 40 mg p.o. nightly by PCP. Request baseline labs from PCP for reference. Echo will be ordered to evaluate for structural heart disease and left ventricular systolic function. Exercise nuclear stress test to evaluate functional capacity, exercise-induced ischemia/arrhythmia.  Benign hypertension Office blood pressures are within acceptable range. Medications reconciled. Re emphasized  the importance of limiting salt in diet. Currently managed by primary care provider.  Mixed hyperlipidemia Currently on statin therapy. Currently managed by primary care provider. Recommend that she have a repeat fasting lipid profile and CMP with PCP after being on the higher dose of atorvastatin for 6 weeks.  This will help reevaluate lipids as well as LFTs.  FINAL MEDICATION LIST END OF ENCOUNTER: No orders of the defined types were placed in this encounter.   Medications Discontinued During This Encounter  Medication Reason   Calcium Carbonate-Vitamin D (CALCIUM-VITAMIN D) 500-200 MG-UNIT tablet      Current Outpatient Medications:    amLODipine (NORVASC) 5 MG tablet, Take 5 mg by mouth daily., Disp: , Rfl:    aspirin 81 MG tablet, Take 81 mg by mouth daily., Disp: , Rfl:    atorvastatin (LIPITOR) 40 MG tablet, Take 20 mg by mouth daily., Disp: , Rfl:    Multiple Vitamins-Minerals (MULTIVITAMIN WITH MINERALS) tablet, Take 1 tablet by mouth daily., Disp: , Rfl:    NON FORMULARY, Organic Powdered Maca Gelatinized, Disp: , Rfl:    Vitamin D, Cholecalciferol, 1000 units CAPS, Take 1 tablet by mouth daily., Disp: , Rfl:    zonisamide (ZONEGRAN) 100 MG capsule, Take 3 capsules (300 mg total) by mouth daily., Disp: 270 capsule, Rfl: 4   Magnesium 250 MG TABS, Take 1 tablet by mouth daily at 12 noon. (Patient not taking: Reported on 03/01/2022), Disp: , Rfl:    zinc gluconate 50 MG tablet, Take 50 mg by mouth daily. (Patient not taking: Reported on 03/01/2022), Disp: , Rfl:   Orders Placed This Encounter  Procedures   PCV MYOCARDIAL PERFUSION WO LEXISCAN   EKG 12-Lead   PCV ECHOCARDIOGRAM COMPLETE    There are no Patient Instructions on file for this visit.   --Continue cardiac medications as reconciled in final medication list. --Return in about 7 weeks (around 04/19/2022) for Follow up, Coronary artery calcification, Review test results. or sooner if needed. --Continue follow-up  with your primary care physician regarding the management of your other chronic comorbid conditions.  Patient's questions and concerns were addressed to her satisfaction. She voices understanding of the instructions provided during this encounter.   This note was created using a voice recognition software as a result there may be grammatical errors inadvertently enclosed that do not reflect the nature of this encounter. Every attempt is made to correct such errors.  Delilah Shan [facility]  Pager: [phone] Office: [phone]  ADDENDUM Outside labs obtained after the office visit. External Labs: Collected: 11/09/2021 provided by referring physician. Hemoglobin 13.1 g/dL, hematocrit 76.8% BUN 12, creatinine 0.96. Sodium 142, potassium  4.4, chloride 107, bicarb 31, AST 20, ALT 25, alkaline phosphatase 76. Total cholesterol 242, triglycerides 58, HDL 85, calculated LDL 148, non-HDL 157 TSH 1.03   Delilah Shan [facility]  Pager: [phone] Office: [phone]

## 2022-03-07 ENCOUNTER — Ambulatory Visit: Payer: Commercial Managed Care - PPO

## 2022-03-07 DIAGNOSIS — I2510 Atherosclerotic heart disease of native coronary artery without angina pectoris: Secondary | ICD-10-CM

## 2022-03-09 ENCOUNTER — Encounter: Payer: Self-pay | Admitting: Cardiology

## 2022-03-09 NOTE — Telephone Encounter (Signed)
From patient   MAR has been updated

## 2022-03-28 ENCOUNTER — Ambulatory Visit: Payer: Commercial Managed Care - PPO

## 2022-03-28 DIAGNOSIS — I2510 Atherosclerotic heart disease of native coronary artery without angina pectoris: Secondary | ICD-10-CM

## 2022-04-27 ENCOUNTER — Ambulatory Visit: Payer: Commercial Managed Care - PPO | Admitting: Cardiology

## 2022-04-27 ENCOUNTER — Encounter: Payer: Self-pay | Admitting: Cardiology

## 2022-04-27 VITALS — BP 137/83 | HR 86 | Resp 14 | Ht 64.0 in | Wt 190.4 lb

## 2022-04-27 DIAGNOSIS — I2510 Atherosclerotic heart disease of native coronary artery without angina pectoris: Secondary | ICD-10-CM

## 2022-04-27 DIAGNOSIS — E782 Mixed hyperlipidemia: Secondary | ICD-10-CM

## 2022-04-27 DIAGNOSIS — I10 Essential (primary) hypertension: Secondary | ICD-10-CM

## 2022-04-27 NOTE — Progress Notes (Signed)
ID:  Holly Colon, DOB November 30, 1965, MRN [id number]  PCP:  Georgann Housekeeper, MD  Cardiologist:  Tessa Lerner, DO, [facility] (established care 03/01/2022)  Date: 04/27/22 Last Office Visit: 03/01/2022  Chief Complaint  Patient presents with   Coronary Artery Disease   Follow-up    7 week    HPI  Holly Colon is a 56 y.o. African-American female whose past medical history and cardiovascular risk factors include: Severe coronary artery calcification (593, 99th percentile), aortic atherosclerosis, hypertension, hyperlipidemia, family history of heart disease.   Patient was referred to the practice for evaluation and management of severe coronary artery calcification and aortic atherosclerosis.  Noncardiac findings notable for pulmonary nodules which are being followed by PCP.  At the last office visit she was having precordial pain likely noncardiac along with the dyspnea.  Given her risk factors that shared decision was to proceed with ischemic workup which was reviewed with her at today's office visit and noted below for further reference.  Since last office visit, has been free of chest pain.  Overall functional capacity remains stable.  She continues to be on Lipitor 40 mg p.o. nightly.  Has not had any repeat repeat labs.  ALLERGIES: No Known Allergies  MEDICATION LIST PRIOR TO VISIT: Current Meds  Medication Sig   amLODipine (NORVASC) 5 MG tablet Take 5 mg by mouth daily.   aspirin 81 MG tablet Take 81 mg by mouth daily.   atorvastatin (LIPITOR) 40 MG tablet Take 20 mg by mouth daily.   BLACK CURRANT SEED OIL PO Take 1,000 mg by mouth daily at 12 noon.   Magnesium 250 MG TABS Take 1 tablet by mouth daily at 12 noon.   Multiple Vitamins-Minerals (MULTIVITAMIN WITH MINERALS) tablet Take 1 tablet by mouth daily.   TURMERIC CURCUMIN PO Take 900 mg by mouth daily at 12 noon.   Vitamin D, Cholecalciferol, 1000 units CAPS Take 1 tablet by mouth daily.   zinc gluconate 50  MG tablet Take 50 mg by mouth daily.   zonisamide (ZONEGRAN) 100 MG capsule Take 3 capsules (300 mg total) by mouth daily.     PAST MEDICAL HISTORY: Past Medical History:  Diagnosis Date   Coronary artery calcification    High cholesterol    Lumbar disc disease    Osteopenia 05/08/2013   Seizures (HCC)    Syncope 05/08/2001   Vitamin D deficiency     PAST SURGICAL HISTORY: History reviewed. No pertinent surgical history.  FAMILY HISTORY: The patient family history includes CVA in her father; Heart attack in her father; Peripheral vascular disease in her father; Stroke in her father.  SOCIAL HISTORY:  The patient  reports that she has never smoked. She has never used smokeless tobacco. She reports that she does not drink alcohol and does not use drugs.  REVIEW OF SYSTEMS: Review of Systems  Cardiovascular:  Negative for chest pain, claudication, dyspnea on exertion, irregular heartbeat, leg swelling, near-syncope, orthopnea, palpitations, paroxysmal nocturnal dyspnea and syncope.  Respiratory:  Negative for shortness of breath.   Hematologic/Lymphatic: Negative for bleeding problem.  Musculoskeletal:  Negative for muscle cramps and myalgias.  Neurological:  Negative for dizziness and light-headedness.   PHYSICAL EXAM:    04/27/2022    2:13 PM 03/01/2022    9:40 AM 05/27/2018   10:49 AM  Vitals with BMI  Height 5\' 4"  5\' 4"    Weight 190 lbs 6 oz 195 lbs   BMI 32.67 33.46   Systolic 137 134  Diastolic 83 80 84  Pulse 86 67 72    Physical Exam  Constitutional: No distress.  Age appropriate, hemodynamically stable.   Neck: No JVD present.  Cardiovascular: Normal rate, regular rhythm, S1 normal, S2 normal, intact distal pulses and normal pulses. Exam reveals no gallop, no S3 and no S4.  No murmur heard. Pulmonary/Chest: Effort normal and breath sounds normal. No stridor. She has no wheezes. She has no rales.  Abdominal: Soft. Bowel sounds are normal. She exhibits no  distension. There is no abdominal tenderness.  Musculoskeletal:        General: No edema.     Cervical back: Neck supple.  Neurological: She is alert and oriented to person, place, and time. She has intact cranial nerves (2-12).  Skin: Skin is warm and moist.   CARDIAC DATABASE: EKG: 03/01/2022: NSR 65bpm, LAE, consider anteroseptal, occasional PVC.   Echocardiogram: 03/07/2022: Normal LV systolic function with visual EF 60-65%. Left ventricle cavity is normal in size. Mild left ventricular hypertrophy. Normal global wall motion. Normal diastolic filling pattern, normal LAP.  Native mitral valve. Mild mitral valve leaflet thickening. Mild mitral valve prolapse with symmetric leaflets. Mild (Grade I) mitral regurgitation. Mild tricuspid regurgitation. No evidence of pulmonary hypertension. No prior study for comparison.    Stress Testing: Exercise nuclear stress test 03/28/2022: Myocardial perfusion is abnormal. There is evidence of ischemia. There is a small defect with mild reduction in uptake present in the apical to mid septal location(s). Overall LV systolic function is normal without regional wall motion abnormalities. Stress LV EF: 63%.  Normal ECG stress. The patient exercised for 6 minutes and 47 seconds of a Bruce protocol, achieving approximately 8.23 METs & 87% MPHR. BP response was normal. Occasional PVC during the stress test. No previous exam available for comparison. Low risk study.   Heart Catheterization: None  Calcium Score: 01/26/2022 Left Main: 0   LAD: 183   LCx: 167   RCA: 242   Total Agatston Score: 593   MESA database percentile: 99 1. Coronary artery calcium score of 593 which is percentile 99 for subjects of the same age, gender and race/ethnicity. 2. Multiple pulmonary nodules. Most significant: 6 mm left solid pulmonary nodule. Per Fleischner Society Guidelines, recommend a non-contrast Chest CT at 3-6 months. If patient is high risk for  malignancy, recommend an additional non-contrast Chest CT at 18-24 months; if patient is low risk for malignancy a non-contrast Chest CT at 18-24 months is optional. These guidelines do not apply to immunocompromised patients and patients with cancer. Follow up in patients with significant comorbidities as clinically warranted. For lung cancer screening, adhere to Lung-RADS guidelines. Reference: Radiology. 2017; 284(1):228-43. 3. Aortic atherosclerosis.  LABORATORY DATA: External Labs: Collected: 11/09/2021 provided by referring physician. Hemoglobin 13.1 g/dL, hematocrit 16.1% BUN 12, creatinine 0.96. Sodium 142, potassium 4.4, chloride 107, bicarb 31, AST 20, ALT 25, alkaline phosphatase 76. Total cholesterol 242, triglycerides 58, HDL 85, calculated LDL 148, non-HDL 157 TSH 1.03    IMPRESSION:    ICD-10-CM   1. Coronary artery calcification seen on CAT scan  I25.10     2. Benign hypertension  I10     3. Mixed hyperlipidemia  E78.2        RECOMMENDATIONS: Holly Colon is a 57 y.o. African-American female whose past medical history and cardiac risk factors include: Severe coronary artery calcification (593, 99th percentile), aortic atherosclerosis, hypertension, hyperlipidemia, family history of heart disease.   Coronary artery calcification Total CAC 593, 99th  percentile. Denies precordial pain or heart failure symptoms. Echocardiogram: Preserved LVEF, normal diastolic function, mild valvular heart disease. Exercise MPI: Overall low risk study. Since the patient is currently asymptomatic now and overall ischemic workup is favorable no additional testing warranted at this time. Reemphasized importance of secondary prevention  Benign hypertension Office blood pressures are within acceptable range. Medications reconciled. Re emphasized the importance of limiting salt in diet. Currently managed by primary care provider.  Mixed hyperlipidemia Currently on statin  therapy She has been on atorvastatin 40 mg p.o. nightly for greater than 6 weeks.  Recommend checking a CMP, fasting lipid profile, and direct LDL to reevaluate therapy.   She will follow-up with PCP.  No additional testing warranted at this time; however, recommend a follow-up echo in 3-5 years to evaluate mitral regurgitation.  I would like to see him back on an annual basis or as needed.  Will defer to the patient.  FINAL MEDICATION LIST END OF ENCOUNTER: No orders of the defined types were placed in this encounter.   Medications Discontinued During This Encounter  Medication Reason   NON FORMULARY       Current Outpatient Medications:    amLODipine (NORVASC) 5 MG tablet, Take 5 mg by mouth daily., Disp: , Rfl:    aspirin 81 MG tablet, Take 81 mg by mouth daily., Disp: , Rfl:    atorvastatin (LIPITOR) 40 MG tablet, Take 20 mg by mouth daily., Disp: , Rfl:    BLACK CURRANT SEED OIL PO, Take 1,000 mg by mouth daily at 12 noon., Disp: , Rfl:    Magnesium 250 MG TABS, Take 1 tablet by mouth daily at 12 noon., Disp: , Rfl:    Multiple Vitamins-Minerals (MULTIVITAMIN WITH MINERALS) tablet, Take 1 tablet by mouth daily., Disp: , Rfl:    TURMERIC CURCUMIN PO, Take 900 mg by mouth daily at 12 noon., Disp: , Rfl:    Vitamin D, Cholecalciferol, 1000 units CAPS, Take 1 tablet by mouth daily., Disp: , Rfl:    zinc gluconate 50 MG tablet, Take 50 mg by mouth daily., Disp: , Rfl:    zonisamide (ZONEGRAN) 100 MG capsule, Take 3 capsules (300 mg total) by mouth daily., Disp: 270 capsule, Rfl: 4  No orders of the defined types were placed in this encounter.   There are no Patient Instructions on file for this visit.   --Continue cardiac medications as reconciled in final medication list. --Return in about 1 year (around 04/28/2023) for Annual follow up visit, Coronary artery calcification. or sooner if needed. --Continue follow-up with your primary care physician regarding the management of your  other chronic comorbid conditions.  Patient's questions and concerns were addressed to her satisfaction. She voices understanding of the instructions provided during this encounter.   This note was created using a voice recognition software as a result there may be grammatical errors inadvertently enclosed that do not reflect the nature of this encounter. Every attempt is made to correct such errors.  Tessa Lerner, [place], [facility]  Pager: [phone] Office: [phone]

## 2022-07-11 ENCOUNTER — Other Ambulatory Visit: Payer: Self-pay | Admitting: Internal Medicine

## 2022-07-11 DIAGNOSIS — R911 Solitary pulmonary nodule: Secondary | ICD-10-CM

## 2022-07-14 ENCOUNTER — Ambulatory Visit
Admission: EM | Admit: 2022-07-14 | Discharge: 2022-07-14 | Disposition: A | Discharge status: Home / Self Care | Payer: Commercial Managed Care - PPO | Attending: Nurse Practitioner | Admitting: Nurse Practitioner

## 2022-07-14 DIAGNOSIS — Z113 Encounter for screening for infections with a predominantly sexual mode of transmission: Secondary | ICD-10-CM

## 2022-07-14 DIAGNOSIS — R300 Dysuria: Secondary | ICD-10-CM

## 2022-07-14 DIAGNOSIS — N3001 Acute cystitis with hematuria: Secondary | ICD-10-CM

## 2022-07-14 LAB — POCT URINALYSIS DIP (MANUAL ENTRY)
Bilirubin, UA: NEGATIVE
Glucose, UA: NEGATIVE mg/dL
Nitrite, UA: NEGATIVE
Protein Ur, POC: 30 mg/dL — AB
Spec Grav, UA: >=1.03 — AB (ref 1.010–1.025)
Urobilinogen, UA: 0.2 E.U./dL
pH, UA: 5.5 (ref 5.0–8.0)

## 2022-07-14 MED ORDER — CEPHALEXIN 500 MG PO CAPS: 500.0000 mg | ORAL_CAPSULE | Freq: Two times a day (BID) | ORAL | 0 refills | Status: AC

## 2022-07-14 NOTE — Discharge Instructions (Signed)
The clinic will contact you with results of the urine culture and vaginal swab done today Start Keflex twice daily for 7 days to treat your urinary tract infection Rest and increase your fluids Follow-up with your gynecologist at your scheduled appointment on Monday, 3/11, please go to the emergency room for any worsening symptoms

## 2022-07-14 NOTE — ED Triage Notes (Signed)
Patient c/o vaginal irritation and blood when wiping. She states she placed a boric acid suppository 2 days ago and that is when she began having symptoms.

## 2022-07-14 NOTE — ED Provider Notes (Signed)
[facility]    CSN: [id number] Arrival date & time: 07/14/22  1254      History   Chief Complaint Chief Complaint  Patient presents with   vaginal irritation    HPI Holly Colon is a 57 y.o. female presents for evaluation of dysuria.  Patient reports 2 days of urinary burning with blood when she wipes.  She denies any urinary urgency, frequency, hematuria, fevers, nausea/vomiting, flank pain.  She has had some vaginal irritation after using a boric acid suppository 2 days ago but no vaginal discharge.  She states she uses monthly as "maintenance".  Denies any STD exposure or concern.  She reports a history of UTIs in the past.  She called her gynecologist today but is not able to be seen until Monday and was advised to come to urgent care for evaluation.  She has not taken any OTC medications for her urinary symptoms.  No other concerns at this time.  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 No pertinent surgical history.  OB History   No obstetric history on file.      Home Medications    Prior to Admission medications   Medication Sig Start Date End Date Taking? Authorizing Provider  cephALEXin (KEFLEX) 500 MG capsule Take 1 capsule (500 mg total) by mouth 2 (two) times daily for 7 days. 07/14/22 07/21/22 Yes Melynda Ripple, NP  amLODipine (NORVASC) 5 MG tablet Take 5 mg by mouth daily. 02/05/22   [provider]  aspirin 81 MG tablet Take 81 mg by mouth daily.    [provider]   atorvastatin (LIPITOR) 40 MG tablet Take 20 mg by mouth daily.    [provider]  BLACK CURRANT SEED OIL PO Take 1,000 mg by mouth daily at 12 noon.    [provider]  Magnesium 250 MG TABS Take 1 tablet by mouth daily at 12 noon.    [provider]  Multiple Vitamins-Minerals (MULTIVITAMIN WITH MINERALS) tablet Take 1 tablet by mouth daily.    [provider]  TURMERIC CURCUMIN PO Take 900 mg by mouth daily at 12 noon.    [provider]  Vitamin D, Cholecalciferol, 1000 units CAPS Take 1 tablet by mouth daily.    [provider]  zinc gluconate 50 MG tablet Take 50 mg by mouth daily.    [provider]  zonisamide (ZONEGRAN) 100 MG capsule Take 3 capsules (300 mg total) by mouth daily. 05/26/14   Penumalli, Earlean Polka, MD    Family History Family History  Problem Relation Age of Onset   Heart attack Father    Stroke Father    CVA Father    Peripheral vascular disease Father     Social History Social History   Tobacco Use   Smoking status: Never   Smokeless tobacco: Never  Vaping Use   Vaping Use: Never used  Substance Use Topics   Alcohol use: No   Drug use:  No     Allergies   Patient has no known allergies.   Review of Systems Review of Systems  Genitourinary:  Positive for dysuria.       Vaginal irritation      Physical Exam Triage Vital Signs ED Triage Vitals  Enc Vitals Group     BP 07/14/22 1307 118/77     Pulse Rate 07/14/22 1307 86     Resp 07/14/22 1307 18     Temp 07/14/22 1307 98.6 F (37 C)     Temp Source 07/14/22 1307 Oral     SpO2 07/14/22 1307 98 %     Weight --      Height --      Head Circumference --      Peak Flow --      Pain Score 07/14/22 1311 0     Pain Loc --      Pain Edu? --      Excl. in [place]? --    No data found.  Updated Vital Signs BP 118/77 (BP Location: Left Arm)   Pulse 86   Temp 98.6 F (37 C) (Oral)   Resp 18   SpO2 98%   Visual Acuity Right Eye  Distance:   Left Eye Distance:   Bilateral Distance:    Right Eye Near:   Left Eye Near:    Bilateral Near:     Physical Exam Vitals and nursing note reviewed.  Constitutional:      Appearance: Normal appearance.  HENT:     Head: Normocephalic and atraumatic.  Eyes:     Pupils: Pupils are equal, round, and reactive to light.  Cardiovascular:     Rate and Rhythm: Normal rate.  Pulmonary:     Effort: Pulmonary effort is normal.  Abdominal:     Tenderness: There is no right CVA tenderness or left CVA tenderness.  Skin:    General: Skin is warm and dry.  Neurological:     General: No focal deficit present.     Mental Status: She is alert and oriented to person, place, and time.  Psychiatric:        Mood and Affect: Mood normal.        Behavior: Behavior normal.      UC Treatments / Results  Labs (all labs ordered are listed, but only abnormal results are displayed) Labs Reviewed  POCT URINALYSIS DIP (MANUAL ENTRY) - Abnormal; Notable for the following components:      Result Value   Clarity, UA cloudy (*)    Ketones, POC UA trace (5) (*)    Spec Grav, UA >=1.030 (*)    Blood, UA small (*)    Protein Ur, POC =30 (*)    Leukocytes, UA Small (1+) (*)    All other components within normal limits  URINE CULTURE  CERVICOVAGINAL ANCILLARY ONLY    EKG   Radiology No results found.  Procedures Procedures (including critical care time)  Medications Ordered in UC Medications - No data to display  Initial Impression / Assessment and Plan / UC Course  I have reviewed the triage vital signs and the nursing notes.  Pertinent labs & imaging results that were available during my care of the patient were reviewed by me and considered in my medical decision making (see chart for details).     UA positive for UTI, will culture and contact with results Start Keflex twice daily for 7 days Vaginal swab is ordered and will contact for  any positive results Rest and  fluids Patient to keep follow-up appointment on Monday with her GYN ER precautions reviewed and patient verbalized understanding Final Clinical Impressions(s) / UC Diagnoses   Final diagnoses:  Dysuria  Acute cystitis with hematuria  Screening examination for STD (sexually transmitted disease)     Discharge Instructions      The clinic will contact you with results of the urine culture and vaginal swab done today Start Keflex twice daily for 7 days to treat your urinary tract infection Rest and increase your fluids Follow-up with your gynecologist at your scheduled appointment on Monday, 3/11, please go to the emergency room for any worsening symptoms     ED Prescriptions     Medication Sig Dispense Auth. Provider   cephALEXin (KEFLEX) 500 MG capsule Take 1 capsule (500 mg total) by mouth 2 (two) times daily for 7 days. 14 capsule Melynda Ripple, NP      PDMP not reviewed this encounter.   Melynda Ripple, NP 07/14/22 [phone]

## 2022-07-16 LAB — URINE CULTURE

## 2022-07-17 LAB — CERVICOVAGINAL ANCILLARY ONLY
Bacterial Vaginitis (gardnerella): NEGATIVE
Candida Glabrata: NEGATIVE
Candida Vaginitis: NEGATIVE
Chlamydia: NEGATIVE
Comment: NEGATIVE
Comment: NEGATIVE
Comment: NEGATIVE
Comment: NEGATIVE
Comment: NEGATIVE
Comment: NORMAL
Neisseria Gonorrhea: NEGATIVE
Trichomonas: NEGATIVE

## 2022-08-07 ENCOUNTER — Ambulatory Visit
Admission: RE | Admit: 2022-08-07 | Discharge: 2022-08-07 | Disposition: A | Discharge status: Home / Self Care | Payer: Commercial Managed Care - PPO | Source: Ambulatory Visit | Attending: Internal Medicine | Admitting: Internal Medicine

## 2022-08-07 DIAGNOSIS — R911 Solitary pulmonary nodule: Secondary | ICD-10-CM

## 2022-12-13 ENCOUNTER — Other Ambulatory Visit: Payer: Self-pay | Admitting: Obstetrics and Gynecology

## 2022-12-13 DIAGNOSIS — Z1231 Encounter for screening mammogram for malignant neoplasm of breast: Secondary | ICD-10-CM

## 2022-12-15 ENCOUNTER — Ambulatory Visit
Admission: RE | Admit: 2022-12-15 | Discharge: 2022-12-15 | Disposition: A | Discharge status: Home / Self Care | Payer: Commercial Managed Care - PPO | Source: Ambulatory Visit | Attending: Obstetrics and Gynecology | Admitting: Obstetrics and Gynecology

## 2022-12-15 DIAGNOSIS — Z1231 Encounter for screening mammogram for malignant neoplasm of breast: Secondary | ICD-10-CM

## 2023-04-27 ENCOUNTER — Ambulatory Visit: Payer: Self-pay | Admitting: Cardiology

## 2023-07-05 ENCOUNTER — Ambulatory Visit: Payer: Commercial Managed Care - PPO

## 2023-07-05 ENCOUNTER — Ambulatory Visit (INDEPENDENT_AMBULATORY_CARE_PROVIDER_SITE_OTHER): Payer: Commercial Managed Care - PPO | Admitting: Podiatry

## 2023-07-05 ENCOUNTER — Encounter: Payer: Self-pay | Admitting: Podiatry

## 2023-07-05 VITALS — Ht 64.0 in | Wt 190.4 lb

## 2023-07-05 DIAGNOSIS — M25572 Pain in left ankle and joints of left foot: Secondary | ICD-10-CM

## 2023-07-05 DIAGNOSIS — M21612 Bunion of left foot: Secondary | ICD-10-CM | POA: Diagnosis not present

## 2023-07-05 NOTE — Progress Notes (Signed)
 Subjective:   Patient ID: Holly Colon, female   DOB: 58 y.o.   MRN: [id number]   HPI Patient presents stating that her big toe joint left is getting worse and she had surgery on her right foot 5 years ago and needs to have the left one corrected.  States that she has tried wider shoes she has tried soaks she has tried cushioning the left 1 without relief.  Patient does not smoke likes to be active   Review of Systems  All other systems reviewed and are negative.       Objective:  Physical Exam Vitals and nursing note reviewed.  Constitutional:      Appearance: She is well-developed.  Pulmonary:     Effort: Pulmonary effort is normal.  Musculoskeletal:        General: Normal range of motion.  Skin:    General: Skin is warm.  Neurological:     Mental Status: She is alert.     Neurovascular status intact muscle strength found to be adequate range of motion adequate with hyperostosis and pain of the first metatarsal head left with redness around the joint surface and pain with activity.  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  This will be an outpatient procedure she should do well and she is going to decide about doing it soon or waiting till the fall.  Patient had all questions answered today concerning the procedure  X-rays indicate elevation of the 1 2 intermetatarsal angle left of approximate 15 degrees with tibial shift noted

## 2023-11-21 ENCOUNTER — Other Ambulatory Visit: Payer: Self-pay | Admitting: Obstetrics and Gynecology

## 2023-11-21 DIAGNOSIS — Z1231 Encounter for screening mammogram for malignant neoplasm of breast: Secondary | ICD-10-CM

## 2023-12-17 ENCOUNTER — Ambulatory Visit
Admission: RE | Admit: 2023-12-17 | Discharge: 2023-12-17 | Disposition: A | Discharge status: Home / Self Care | Source: Ambulatory Visit | Attending: Obstetrics and Gynecology | Admitting: Obstetrics and Gynecology

## 2023-12-17 DIAGNOSIS — Z1231 Encounter for screening mammogram for malignant neoplasm of breast: Secondary | ICD-10-CM

## 2024-05-29 ENCOUNTER — Other Ambulatory Visit: Payer: Self-pay | Admitting: Internal Medicine

## 2024-05-29 DIAGNOSIS — R911 Solitary pulmonary nodule: Secondary | ICD-10-CM

## 2024-06-20 ENCOUNTER — Other Ambulatory Visit
